# Patient Record
Sex: Female | Born: 1940 | Race: White | Hispanic: No | Smoking: Never smoker
Health system: Southern US, Community
[De-identification: ages and names within clinical notes are randomized; demographics above are authoritative.]

## PROBLEM LIST (undated history)

## (undated) DIAGNOSIS — M5430 Sciatica, unspecified side: Secondary | ICD-10-CM

## (undated) DIAGNOSIS — N952 Postmenopausal atrophic vaginitis: Secondary | ICD-10-CM

## (undated) DIAGNOSIS — R7611 Nonspecific reaction to tuberculin skin test without active tuberculosis: Secondary | ICD-10-CM

## (undated) DIAGNOSIS — M1990 Unspecified osteoarthritis, unspecified site: Secondary | ICD-10-CM

## (undated) DIAGNOSIS — Z87440 Personal history of urinary (tract) infections: Secondary | ICD-10-CM

## (undated) DIAGNOSIS — I10 Essential (primary) hypertension: Secondary | ICD-10-CM

## (undated) HISTORY — DX: Essential (primary) hypertension: I10

## (undated) HISTORY — DX: Sciatica, unspecified side: M54.30

## (undated) HISTORY — DX: Postmenopausal atrophic vaginitis: N95.2

## (undated) HISTORY — DX: Personal history of urinary (tract) infections: Z87.440

## (undated) HISTORY — DX: Nonspecific reaction to tuberculin skin test without active tuberculosis: R76.11

## (undated) HISTORY — DX: Unspecified osteoarthritis, unspecified site: M19.90

## (undated) HISTORY — PX: ENDOMETRIAL ABLATION: SHX621

## (undated) HISTORY — PX: INGUINAL HERNIA REPAIR: SUR1180

## (undated) HISTORY — PX: APPENDECTOMY: SHX54

---

## 1998-01-05 ENCOUNTER — Other Ambulatory Visit: Admission: RE | Admit: 1998-01-05 | Discharge: 1998-01-05 | Payer: Self-pay | Admitting: *Deleted

## 1999-01-22 ENCOUNTER — Other Ambulatory Visit: Admission: RE | Admit: 1999-01-22 | Discharge: 1999-01-22 | Payer: Self-pay | Admitting: *Deleted

## 2000-01-20 ENCOUNTER — Encounter: Admission: RE | Admit: 2000-01-20 | Discharge: 2000-01-20 | Payer: Self-pay | Admitting: *Deleted

## 2000-02-04 ENCOUNTER — Other Ambulatory Visit: Admission: RE | Admit: 2000-02-04 | Discharge: 2000-02-04 | Payer: Self-pay | Admitting: *Deleted

## 2001-01-21 ENCOUNTER — Encounter: Admission: RE | Admit: 2001-01-21 | Discharge: 2001-01-21 | Payer: Self-pay | Admitting: *Deleted

## 2001-02-23 ENCOUNTER — Other Ambulatory Visit: Admission: RE | Admit: 2001-02-23 | Discharge: 2001-02-23 | Payer: Self-pay | Admitting: *Deleted

## 2002-01-24 ENCOUNTER — Encounter: Admission: RE | Admit: 2002-01-24 | Discharge: 2002-01-24 | Payer: Self-pay | Admitting: *Deleted

## 2002-02-24 ENCOUNTER — Other Ambulatory Visit: Admission: RE | Admit: 2002-02-24 | Discharge: 2002-02-24 | Payer: Self-pay | Admitting: *Deleted

## 2003-02-16 ENCOUNTER — Encounter: Admission: RE | Admit: 2003-02-16 | Discharge: 2003-02-16 | Payer: Self-pay | Admitting: *Deleted

## 2003-04-19 ENCOUNTER — Other Ambulatory Visit: Admission: RE | Admit: 2003-04-19 | Discharge: 2003-04-19 | Payer: Self-pay | Admitting: *Deleted

## 2004-03-12 ENCOUNTER — Encounter: Admission: RE | Admit: 2004-03-12 | Discharge: 2004-03-12 | Payer: Self-pay | Admitting: *Deleted

## 2005-01-08 ENCOUNTER — Ambulatory Visit: Payer: Self-pay

## 2007-03-15 ENCOUNTER — Encounter: Admission: RE | Admit: 2007-03-15 | Discharge: 2007-03-15 | Payer: Self-pay | Admitting: Internal Medicine

## 2008-03-14 ENCOUNTER — Ambulatory Visit: Payer: Self-pay | Admitting: Internal Medicine

## 2008-10-05 ENCOUNTER — Ambulatory Visit: Payer: Self-pay | Admitting: Internal Medicine

## 2009-04-09 ENCOUNTER — Ambulatory Visit: Payer: Self-pay | Admitting: Internal Medicine

## 2009-07-09 ENCOUNTER — Ambulatory Visit: Payer: Self-pay | Admitting: Internal Medicine

## 2009-11-30 ENCOUNTER — Ambulatory Visit: Payer: Self-pay | Admitting: Internal Medicine

## 2010-04-16 ENCOUNTER — Ambulatory Visit: Payer: Self-pay | Admitting: Internal Medicine

## 2010-10-31 ENCOUNTER — Encounter: Payer: Self-pay | Admitting: Internal Medicine

## 2010-10-31 ENCOUNTER — Ambulatory Visit (INDEPENDENT_AMBULATORY_CARE_PROVIDER_SITE_OTHER): Payer: Medicare Other | Admitting: Internal Medicine

## 2010-10-31 DIAGNOSIS — F419 Anxiety disorder, unspecified: Secondary | ICD-10-CM

## 2010-10-31 DIAGNOSIS — I10 Essential (primary) hypertension: Secondary | ICD-10-CM

## 2010-10-31 DIAGNOSIS — G4700 Insomnia, unspecified: Secondary | ICD-10-CM | POA: Insufficient documentation

## 2010-10-31 DIAGNOSIS — F411 Generalized anxiety disorder: Secondary | ICD-10-CM

## 2010-10-31 MED ORDER — LISINOPRIL 5 MG PO TABS: 5.0000 mg | ORAL_TABLET | Freq: Every day | ORAL | Status: DC

## 2010-10-31 NOTE — Progress Notes (Signed)
  Subjective:    Patient ID: Amber Garrett, female    DOB: 1940-08-08, 70 y.o.   MRN: [id number]  HPI for 6 month follow up on HTN , anxiety, and insomnia. Wants to cut down on Xanax if possible. Can taper slowly. Is currently taking Xanax tid. Takes Ambien 10 mg hs.    Review of Systems     Objective:   Physical Exam Chest is clear; Cor RRR; Ext without edema.        Assessment & Plan:  1- Anxiety-stable may taper Xanax slowly over 2 months 2- Hypertension- stable on Lisinopril. 3- Insomnia- pretty much needs Ambien hs.

## 2010-10-31 NOTE — Patient Instructions (Signed)
Take meds as directed . See [place] in 6 months.

## 2010-12-05 ENCOUNTER — Other Ambulatory Visit: Payer: Self-pay | Admitting: *Deleted

## 2010-12-05 MED ORDER — ALPRAZOLAM 0.5 MG PO TABS: ORAL_TABLET | ORAL | Status: DC

## 2011-01-01 ENCOUNTER — Other Ambulatory Visit: Payer: Self-pay

## 2011-01-01 MED ORDER — ZOLPIDEM TARTRATE 10 MG PO TABS: 10.0000 mg | ORAL_TABLET | Freq: Every evening | ORAL | Status: DC | PRN

## 2011-04-03 ENCOUNTER — Ambulatory Visit (INDEPENDENT_AMBULATORY_CARE_PROVIDER_SITE_OTHER): Payer: Medicare Other | Admitting: Internal Medicine

## 2011-04-03 ENCOUNTER — Encounter: Payer: Self-pay | Admitting: Internal Medicine

## 2011-04-03 ENCOUNTER — Telehealth: Payer: Self-pay

## 2011-04-03 VITALS — BP 100/82 | HR 84 | Temp 101.6°F

## 2011-04-03 DIAGNOSIS — J111 Influenza due to unidentified influenza virus with other respiratory manifestations: Secondary | ICD-10-CM

## 2011-04-03 DIAGNOSIS — H9190 Unspecified hearing loss, unspecified ear: Secondary | ICD-10-CM | POA: Insufficient documentation

## 2011-04-03 DIAGNOSIS — R05 Cough: Secondary | ICD-10-CM

## 2011-04-03 DIAGNOSIS — J40 Bronchitis, not specified as acute or chronic: Secondary | ICD-10-CM

## 2011-04-03 DIAGNOSIS — R509 Fever, unspecified: Secondary | ICD-10-CM

## 2011-04-03 LAB — CBC WITH DIFFERENTIAL/PLATELET
HCT: 39.4 % (ref 36.0–46.0)
Hemoglobin: 12.7 g/dL (ref 12.0–15.0)
Lymphs Abs: 1.5 10*3/uL (ref 0.7–4.0)
MCH: 30.9 pg (ref 26.0–34.0)
MCHC: 32.3 g/dL (ref 30.0–36.0)
Monocytes Absolute: 0.4 10*3/uL (ref 0.1–1.0)
Monocytes Relative: 5 % (ref 3–12)
Neutro Abs: 5.9 10*3/uL (ref 1.7–7.7)
RBC: 4.11 MIL/uL (ref 3.87–5.11)

## 2011-04-03 NOTE — Progress Notes (Signed)
Attempted to call patient. No answer at  2:22pm

## 2011-04-03 NOTE — Progress Notes (Signed)
  Subjective:    Patient ID: Amber Garrett, female    DOB: [birth year], 70 y.o.   MRN: [id number]  HPI Patient took an influenza immunization on  the morning of November 26. Several hours later she began to have dry cough. The following day she went to see a new grandchild out-of-town. Yesterday began with fever, chills and myalgias. She thinks influenza immunization made her ill but I think it was a coincidence. We are ready seeing influenza in the community. Cough is nonproductive but sounds deep and congested. Initially had a bit of a scratchy throat. Decreased appetite, lethargy and malaise.    Review of Systems     Objective:   Physical Exam HEENT exam: TMs are clear. She wears bilateral hearing aids. Pharynx is only slightly injected without exudate. Neck is supple without adenopathy. Chest clear to auscultation.        Assessment & Plan:  Influenza  Bronchitis  Hypertension  Hearing loss  Plan: CBC with differential obtained. Nasal swab for influenza testing. Prescribe Tamiflu 75 mg twice daily for 5 days. Zithromax Z-PAK take 2 tablets by mouth day one followed by 1 tablet by mouth days 2 through 5 for bronchitis. Tessalon Perles 100 mg proceed #60) 2 by mouth 3 times a day when necessary cough with no refill. Call if not better in 48-72 hours or sooner if worse.  Addendum CBC shows white blood cell count be within normal limits.

## 2011-04-03 NOTE — Patient Instructions (Signed)
Start Tamiflu immediately 75 mg twice daily for 5 days. Take Zithromax Z-PAK 2 tablets by mouth day one followed by 1 tablet by mouth days 2 through 5. Take Tessalon Perles as needed for cough 2 tablets by mouth 3 times daily. Call if not better in 48-72 hours or sooner if worse. It is my opinion that you were exposed to influenza prior to taking the vaccine on Monday morning November 26.

## 2011-04-04 ENCOUNTER — Telehealth: Payer: Self-pay

## 2011-04-04 NOTE — Telephone Encounter (Signed)
Patient informed of cbc results. Feeling better today already.

## 2011-04-08 NOTE — Telephone Encounter (Signed)
Patient informed of her cbc results.

## 2011-05-12 ENCOUNTER — Other Ambulatory Visit: Payer: Medicare Other | Admitting: Internal Medicine

## 2011-05-12 DIAGNOSIS — I10 Essential (primary) hypertension: Secondary | ICD-10-CM | POA: Diagnosis not present

## 2011-05-12 DIAGNOSIS — E559 Vitamin D deficiency, unspecified: Secondary | ICD-10-CM

## 2011-05-12 DIAGNOSIS — F411 Generalized anxiety disorder: Secondary | ICD-10-CM | POA: Diagnosis not present

## 2011-05-12 LAB — CBC WITH DIFFERENTIAL/PLATELET
Eosinophils Absolute: 0.1 10*3/uL (ref 0.0–0.7)
Eosinophils Relative: 2 % (ref 0–5)
Lymphs Abs: 2.5 10*3/uL (ref 0.7–4.0)
MCH: 32.2 pg (ref 26.0–34.0)
MCHC: 33.5 g/dL (ref 30.0–36.0)
MCV: 96.2 fL (ref 78.0–100.0)
Platelets: 273 10*3/uL (ref 150–400)
RBC: 4.47 MIL/uL (ref 3.87–5.11)

## 2011-05-12 LAB — LIPID PANEL
Cholesterol: 199 mg/dL (ref 0–200)
HDL: 80 mg/dL (ref >39)
Total CHOL/HDL Ratio: 2.5 Ratio

## 2011-05-12 LAB — COMPREHENSIVE METABOLIC PANEL
ALT: 12 U/L (ref 0–35)
CO2: 26 mEq/L (ref 19–32)
Creat: 0.9 mg/dL (ref 0.50–1.10)
Glucose, Bld: 93 mg/dL (ref 70–99)
Total Bilirubin: 0.6 mg/dL (ref 0.3–1.2)

## 2011-05-12 LAB — TSH: TSH: 1.42 u[IU]/mL (ref 0.350–4.500)

## 2011-05-13 ENCOUNTER — Ambulatory Visit (INDEPENDENT_AMBULATORY_CARE_PROVIDER_SITE_OTHER): Payer: Medicare Other | Admitting: Internal Medicine

## 2011-05-13 ENCOUNTER — Encounter: Payer: Self-pay | Admitting: Internal Medicine

## 2011-05-13 VITALS — BP 114/82 | HR 76 | Temp 98.6°F | Ht 64.0 in | Wt 137.0 lb

## 2011-05-13 DIAGNOSIS — M8580 Other specified disorders of bone density and structure, unspecified site: Secondary | ICD-10-CM

## 2011-05-13 DIAGNOSIS — M1990 Unspecified osteoarthritis, unspecified site: Secondary | ICD-10-CM

## 2011-05-13 DIAGNOSIS — Z0000 Encounter for general adult medical examination without abnormal findings: Secondary | ICD-10-CM | POA: Diagnosis not present

## 2011-05-13 DIAGNOSIS — F411 Generalized anxiety disorder: Secondary | ICD-10-CM

## 2011-05-13 DIAGNOSIS — F419 Anxiety disorder, unspecified: Secondary | ICD-10-CM

## 2011-05-13 DIAGNOSIS — M949 Disorder of cartilage, unspecified: Secondary | ICD-10-CM | POA: Diagnosis not present

## 2011-05-13 DIAGNOSIS — Z228 Carrier of other infectious diseases: Secondary | ICD-10-CM

## 2011-05-13 DIAGNOSIS — I10 Essential (primary) hypertension: Secondary | ICD-10-CM

## 2011-05-13 DIAGNOSIS — Z9289 Personal history of other medical treatment: Secondary | ICD-10-CM

## 2011-05-13 DIAGNOSIS — Z8669 Personal history of other diseases of the nervous system and sense organs: Secondary | ICD-10-CM

## 2011-05-13 DIAGNOSIS — M899 Disorder of bone, unspecified: Secondary | ICD-10-CM

## 2011-05-13 LAB — POCT URINALYSIS DIPSTICK
Bilirubin, UA: NEGATIVE
Blood, UA: NEGATIVE
Ketones, UA: NEGATIVE
Spec Grav, UA: 1.015
pH, UA: 6.5

## 2011-05-19 DIAGNOSIS — H2510 Age-related nuclear cataract, unspecified eye: Secondary | ICD-10-CM | POA: Diagnosis not present

## 2011-05-26 ENCOUNTER — Encounter: Payer: Self-pay | Admitting: Internal Medicine

## 2011-05-26 DIAGNOSIS — Z1231 Encounter for screening mammogram for malignant neoplasm of breast: Secondary | ICD-10-CM | POA: Diagnosis not present

## 2011-07-03 ENCOUNTER — Other Ambulatory Visit: Payer: Self-pay

## 2011-07-03 MED ORDER — ALPRAZOLAM 0.5 MG PO TABS: ORAL_TABLET | ORAL | Status: DC

## 2011-07-03 MED ORDER — ZOLPIDEM TARTRATE 10 MG PO TABS: 10.0000 mg | ORAL_TABLET | Freq: Every evening | ORAL | Status: DC | PRN

## 2011-07-11 DIAGNOSIS — S239XXA Sprain of unspecified parts of thorax, initial encounter: Secondary | ICD-10-CM | POA: Diagnosis not present

## 2011-07-11 DIAGNOSIS — S335XXA Sprain of ligaments of lumbar spine, initial encounter: Secondary | ICD-10-CM | POA: Diagnosis not present

## 2011-07-11 DIAGNOSIS — M999 Biomechanical lesion, unspecified: Secondary | ICD-10-CM | POA: Diagnosis not present

## 2011-07-11 DIAGNOSIS — M5380 Other specified dorsopathies, site unspecified: Secondary | ICD-10-CM | POA: Diagnosis not present

## 2011-07-15 DIAGNOSIS — S239XXA Sprain of unspecified parts of thorax, initial encounter: Secondary | ICD-10-CM | POA: Diagnosis not present

## 2011-07-15 DIAGNOSIS — S335XXA Sprain of ligaments of lumbar spine, initial encounter: Secondary | ICD-10-CM | POA: Diagnosis not present

## 2011-07-15 DIAGNOSIS — M5380 Other specified dorsopathies, site unspecified: Secondary | ICD-10-CM | POA: Diagnosis not present

## 2011-07-15 DIAGNOSIS — M999 Biomechanical lesion, unspecified: Secondary | ICD-10-CM | POA: Diagnosis not present

## 2011-07-18 DIAGNOSIS — S335XXA Sprain of ligaments of lumbar spine, initial encounter: Secondary | ICD-10-CM | POA: Diagnosis not present

## 2011-07-18 DIAGNOSIS — M5380 Other specified dorsopathies, site unspecified: Secondary | ICD-10-CM | POA: Diagnosis not present

## 2011-07-18 DIAGNOSIS — S239XXA Sprain of unspecified parts of thorax, initial encounter: Secondary | ICD-10-CM | POA: Diagnosis not present

## 2011-07-18 DIAGNOSIS — M999 Biomechanical lesion, unspecified: Secondary | ICD-10-CM | POA: Diagnosis not present

## 2011-07-19 NOTE — Progress Notes (Signed)
Subjective:    Patient ID: Amber Garrett, female    DOB: 11/02/1940, 71 y.o.   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   Blood pressure is well controlled on Zestril. Take Xanax sparingly for anxiety. Her second husband has multiple sclerosis but is doing fairly well.  Past medical history: Possible allergy to Macrodantin because it caused rash on arms only. History of C-section and incisional hernia with appendectomy 1973, C-section in [place] in 1970, endometrial polyp, endometrial ablation and resection of submucous myoma 1998. Pneumovax November 2009, gets annual influenza immunization. Colonoscopy 2007 with repeat study suggested in 10 years. Gets annual mammogram.  Family history: Father died at age 19 of pulmonary fibrosis. Mother with history of hypertension and dementia. One brother in good health. A son and a daughter in good health.  Social history: Patient has a Geographical information systems officer. Does not smoke social alcohol consumption consisting of a couple glasses of wine per week. She does strength training and walking regularly.  In 2006 patient had 2-D echocardiogram to evaluate cardiac murmur. She had moderate tricuspid regurgitation with elevation of right ventricular systolic pressure at 30-40 mm consistent with mild pulmonary hypertension. Mitral valve leaflets were thickened but opened well with no stenosis. There was trace mitral regurgitation.  Dr. Kirstie Peri needed colonoscopy 10/15/2005 showing only diverticulosis.  History of bone density study January 2012 showing osteopenia.    Review of Systems  Constitutional: Negative.   HENT: Negative.   Eyes: Negative.   Respiratory: Negative.   Cardiovascular:       History of systolic murmur see dictation regarding 2-D echocardiogram 2006  Gastrointestinal: Negative.     Genitourinary:       History of vaginal atrophy  Musculoskeletal: Negative.   Neurological: Negative.   Psychiatric/Behavioral:       History of anxiety and insomnia       Objective:   Physical Exam  Nursing note and vitals reviewed. Constitutional: She is oriented to person, place, and time. She appears well-developed and well-nourished. No distress.  HENT:  Head: Normocephalic and atraumatic.  Right Ear: External ear normal.  Left Ear: External ear normal.  Mouth/Throat: Oropharynx is clear and moist.  Eyes: Conjunctivae and EOM are normal. Pupils are equal, round, and reactive to light. Right eye exhibits no discharge. Left eye exhibits no discharge. No scleral icterus.  Neck: Neck supple. No JVD present. No thyromegaly present.  Cardiovascular: Normal rate, regular rhythm and intact distal pulses.   Murmur heard.      1/6 systolic ejection murmur  Pulmonary/Chest: Effort normal and breath sounds normal.       Breasts normal female  Abdominal: Soft. Bowel sounds are normal. She exhibits no mass. There is no tenderness. There is no rebound.  Genitourinary:       Deferred to [facility]  Musculoskeletal: Normal range of motion. She exhibits no edema.  Lymphadenopathy:    She has no cervical adenopathy.  Neurological: She is alert and oriented to person, place, and time. She has normal reflexes. No cranial nerve deficit.  Skin: Skin is warm and dry. No rash noted. She is not diaphoretic.  Psychiatric: She has a normal mood and affect. Her behavior is normal. Judgment and thought content normal.          Assessment & Plan:  Hypertension-well controlled  Anxiety  Insomnia  History of positive PPD  Osteopenia  Osteoarthritis  History of sciatica  History of vaginal atrophy  Plan: Patient is to return in 6 months for office visit blood pressure check

## 2011-07-19 NOTE — Patient Instructions (Signed)
Continue same medications and return in 6 months 

## 2011-07-21 DIAGNOSIS — S239XXA Sprain of unspecified parts of thorax, initial encounter: Secondary | ICD-10-CM | POA: Diagnosis not present

## 2011-07-21 DIAGNOSIS — M5380 Other specified dorsopathies, site unspecified: Secondary | ICD-10-CM | POA: Diagnosis not present

## 2011-07-21 DIAGNOSIS — M999 Biomechanical lesion, unspecified: Secondary | ICD-10-CM | POA: Diagnosis not present

## 2011-07-21 DIAGNOSIS — S335XXA Sprain of ligaments of lumbar spine, initial encounter: Secondary | ICD-10-CM | POA: Diagnosis not present

## 2011-07-24 DIAGNOSIS — M5380 Other specified dorsopathies, site unspecified: Secondary | ICD-10-CM | POA: Diagnosis not present

## 2011-07-24 DIAGNOSIS — S239XXA Sprain of unspecified parts of thorax, initial encounter: Secondary | ICD-10-CM | POA: Diagnosis not present

## 2011-07-24 DIAGNOSIS — S335XXA Sprain of ligaments of lumbar spine, initial encounter: Secondary | ICD-10-CM | POA: Diagnosis not present

## 2011-07-24 DIAGNOSIS — M999 Biomechanical lesion, unspecified: Secondary | ICD-10-CM | POA: Diagnosis not present

## 2011-07-28 DIAGNOSIS — M999 Biomechanical lesion, unspecified: Secondary | ICD-10-CM | POA: Diagnosis not present

## 2011-07-28 DIAGNOSIS — M5380 Other specified dorsopathies, site unspecified: Secondary | ICD-10-CM | POA: Diagnosis not present

## 2011-07-28 DIAGNOSIS — S335XXA Sprain of ligaments of lumbar spine, initial encounter: Secondary | ICD-10-CM | POA: Diagnosis not present

## 2011-07-28 DIAGNOSIS — S239XXA Sprain of unspecified parts of thorax, initial encounter: Secondary | ICD-10-CM | POA: Diagnosis not present

## 2011-07-31 DIAGNOSIS — S335XXA Sprain of ligaments of lumbar spine, initial encounter: Secondary | ICD-10-CM | POA: Diagnosis not present

## 2011-07-31 DIAGNOSIS — M5380 Other specified dorsopathies, site unspecified: Secondary | ICD-10-CM | POA: Diagnosis not present

## 2011-07-31 DIAGNOSIS — M999 Biomechanical lesion, unspecified: Secondary | ICD-10-CM | POA: Diagnosis not present

## 2011-07-31 DIAGNOSIS — S239XXA Sprain of unspecified parts of thorax, initial encounter: Secondary | ICD-10-CM | POA: Diagnosis not present

## 2011-08-12 DIAGNOSIS — M5380 Other specified dorsopathies, site unspecified: Secondary | ICD-10-CM | POA: Diagnosis not present

## 2011-08-12 DIAGNOSIS — M999 Biomechanical lesion, unspecified: Secondary | ICD-10-CM | POA: Diagnosis not present

## 2011-08-12 DIAGNOSIS — S335XXA Sprain of ligaments of lumbar spine, initial encounter: Secondary | ICD-10-CM | POA: Diagnosis not present

## 2011-08-12 DIAGNOSIS — S239XXA Sprain of unspecified parts of thorax, initial encounter: Secondary | ICD-10-CM | POA: Diagnosis not present

## 2011-08-14 DIAGNOSIS — S335XXA Sprain of ligaments of lumbar spine, initial encounter: Secondary | ICD-10-CM | POA: Diagnosis not present

## 2011-08-14 DIAGNOSIS — S239XXA Sprain of unspecified parts of thorax, initial encounter: Secondary | ICD-10-CM | POA: Diagnosis not present

## 2011-08-14 DIAGNOSIS — M5380 Other specified dorsopathies, site unspecified: Secondary | ICD-10-CM | POA: Diagnosis not present

## 2011-08-14 DIAGNOSIS — M999 Biomechanical lesion, unspecified: Secondary | ICD-10-CM | POA: Diagnosis not present

## 2011-08-17 DIAGNOSIS — J019 Acute sinusitis, unspecified: Secondary | ICD-10-CM | POA: Diagnosis not present

## 2011-08-17 DIAGNOSIS — J209 Acute bronchitis, unspecified: Secondary | ICD-10-CM | POA: Diagnosis not present

## 2011-08-28 DIAGNOSIS — S335XXA Sprain of ligaments of lumbar spine, initial encounter: Secondary | ICD-10-CM | POA: Diagnosis not present

## 2011-08-28 DIAGNOSIS — M999 Biomechanical lesion, unspecified: Secondary | ICD-10-CM | POA: Diagnosis not present

## 2011-08-28 DIAGNOSIS — S239XXA Sprain of unspecified parts of thorax, initial encounter: Secondary | ICD-10-CM | POA: Diagnosis not present

## 2011-09-09 DIAGNOSIS — M999 Biomechanical lesion, unspecified: Secondary | ICD-10-CM | POA: Diagnosis not present

## 2011-09-09 DIAGNOSIS — S239XXA Sprain of unspecified parts of thorax, initial encounter: Secondary | ICD-10-CM | POA: Diagnosis not present

## 2011-09-09 DIAGNOSIS — S335XXA Sprain of ligaments of lumbar spine, initial encounter: Secondary | ICD-10-CM | POA: Diagnosis not present

## 2011-09-16 DIAGNOSIS — S239XXA Sprain of unspecified parts of thorax, initial encounter: Secondary | ICD-10-CM | POA: Diagnosis not present

## 2011-09-16 DIAGNOSIS — M999 Biomechanical lesion, unspecified: Secondary | ICD-10-CM | POA: Diagnosis not present

## 2011-09-16 DIAGNOSIS — S335XXA Sprain of ligaments of lumbar spine, initial encounter: Secondary | ICD-10-CM | POA: Diagnosis not present

## 2011-09-26 DIAGNOSIS — S335XXA Sprain of ligaments of lumbar spine, initial encounter: Secondary | ICD-10-CM | POA: Diagnosis not present

## 2011-09-26 DIAGNOSIS — S239XXA Sprain of unspecified parts of thorax, initial encounter: Secondary | ICD-10-CM | POA: Diagnosis not present

## 2011-09-26 DIAGNOSIS — M999 Biomechanical lesion, unspecified: Secondary | ICD-10-CM | POA: Diagnosis not present

## 2011-10-08 DIAGNOSIS — M999 Biomechanical lesion, unspecified: Secondary | ICD-10-CM | POA: Diagnosis not present

## 2011-10-08 DIAGNOSIS — S335XXA Sprain of ligaments of lumbar spine, initial encounter: Secondary | ICD-10-CM | POA: Diagnosis not present

## 2011-10-08 DIAGNOSIS — S239XXA Sprain of unspecified parts of thorax, initial encounter: Secondary | ICD-10-CM | POA: Diagnosis not present

## 2011-10-22 DIAGNOSIS — M999 Biomechanical lesion, unspecified: Secondary | ICD-10-CM | POA: Diagnosis not present

## 2011-10-22 DIAGNOSIS — S335XXA Sprain of ligaments of lumbar spine, initial encounter: Secondary | ICD-10-CM | POA: Diagnosis not present

## 2011-10-22 DIAGNOSIS — S239XXA Sprain of unspecified parts of thorax, initial encounter: Secondary | ICD-10-CM | POA: Diagnosis not present

## 2011-11-05 DIAGNOSIS — S335XXA Sprain of ligaments of lumbar spine, initial encounter: Secondary | ICD-10-CM | POA: Diagnosis not present

## 2011-11-05 DIAGNOSIS — S239XXA Sprain of unspecified parts of thorax, initial encounter: Secondary | ICD-10-CM | POA: Diagnosis not present

## 2011-11-05 DIAGNOSIS — M999 Biomechanical lesion, unspecified: Secondary | ICD-10-CM | POA: Diagnosis not present

## 2011-11-07 ENCOUNTER — Other Ambulatory Visit: Payer: Self-pay

## 2011-11-07 DIAGNOSIS — I10 Essential (primary) hypertension: Secondary | ICD-10-CM

## 2011-11-07 MED ORDER — LISINOPRIL 5 MG PO TABS: 5.0000 mg | ORAL_TABLET | Freq: Every day | ORAL | Status: DC

## 2011-11-13 ENCOUNTER — Encounter: Payer: Self-pay | Admitting: Internal Medicine

## 2011-11-13 ENCOUNTER — Ambulatory Visit (INDEPENDENT_AMBULATORY_CARE_PROVIDER_SITE_OTHER): Payer: Medicare Other | Admitting: Internal Medicine

## 2011-11-13 VITALS — BP 108/78 | HR 68 | Temp 97.9°F | Ht 64.0 in | Wt 141.0 lb

## 2011-11-13 DIAGNOSIS — J309 Allergic rhinitis, unspecified: Secondary | ICD-10-CM | POA: Insufficient documentation

## 2011-11-13 DIAGNOSIS — M549 Dorsalgia, unspecified: Secondary | ICD-10-CM | POA: Insufficient documentation

## 2011-11-13 DIAGNOSIS — G4700 Insomnia, unspecified: Secondary | ICD-10-CM | POA: Diagnosis not present

## 2011-11-13 DIAGNOSIS — I10 Essential (primary) hypertension: Secondary | ICD-10-CM

## 2011-11-13 DIAGNOSIS — F419 Anxiety disorder, unspecified: Secondary | ICD-10-CM

## 2011-11-13 DIAGNOSIS — F411 Generalized anxiety disorder: Secondary | ICD-10-CM

## 2011-11-13 LAB — BASIC METABOLIC PANEL
Chloride: 102 mEq/L (ref 96–112)
Potassium: 4.3 mEq/L (ref 3.5–5.3)
Sodium: 137 mEq/L (ref 135–145)

## 2011-11-13 NOTE — Progress Notes (Signed)
  Subjective:    Patient ID: Amber Garrett, female    DOB: [birth year], 71 y.o.   MRN: [id number]  HPI 71 year old white female with history of hypertension in today for six-month recheck. Over the past several weeks had a sinus infection for which a Z-Pak and generic Flonase nasal spray were prescribed. She is found Flonase nasal spray to be helpful. She's had some issues recently with right radiculopathy and low back pain. Sometimes it has kept her awake at night. She has seen a chiropractor for treatment. 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 Had x-ray done by chiropractor showing some degenerative disc disease. Was also told she has scoliosis. Has not had an MRI. Husband has had MS for about 11 years she thinks. Recently saw Dr. Sandria Manly who placed him on new medication. This situation is stressful for patient. Her mother who is in her 90s is also causing some stress as well. Patient takes Xanax as needed for anxiety. Takes Ambien to sleep.    Review of Systems     Objective:   Physical Exam HEENT exam: TMs and pharynx are clear; neck is supple without thyromegaly; chest: clear to auscultation; cardiac exam: regular rate and rhythm normal S1 and S2; extremities without edema. Straight leg raising on the right is negative at 90. Muscle strength is 5 over 5 in the right lower extremity and deep tendon reflexes 2+ and symmetrical       Assessment & Plan:  Allergic rhinitis-refill generic Flonase nasal spray for one year 2 sprays in each nostril daily  Anxiety-has Xanax on hand  Insomnia-treat with Ambien  Right radiculopathy and low back pain: Symptoms are consistent with sciatica. Patient could be referred to physical therapy.. Hold off on that for nail. Prescribe Flexeril 10 mg (#30) one half to one tablet by mouth each bedtime as needed #30 with refills.  Hypertension-under good control on current regimen  Plan: Return in 6 months for physical examination. Back pain persists, consider  referral to physical therapy before getting MRI. She's been dealing with these symptoms for years but only recently has had recurrence.

## 2011-11-13 NOTE — Patient Instructions (Addendum)
Continue same treatment for hypertension. Take Flexeril 1/2-1 tablet at bedtime as needed for severe back pain. Use Flonase nasal spray daily as needed for allergic rhinitis symptoms. Continue Ambien for insomnia and Xanax for anxiety. See again in 6 months for physical exam.

## 2011-11-18 DIAGNOSIS — S335XXA Sprain of ligaments of lumbar spine, initial encounter: Secondary | ICD-10-CM | POA: Diagnosis not present

## 2011-11-18 DIAGNOSIS — M999 Biomechanical lesion, unspecified: Secondary | ICD-10-CM | POA: Diagnosis not present

## 2011-11-18 DIAGNOSIS — S239XXA Sprain of unspecified parts of thorax, initial encounter: Secondary | ICD-10-CM | POA: Diagnosis not present

## 2011-11-24 ENCOUNTER — Other Ambulatory Visit: Payer: Self-pay

## 2011-11-24 MED ORDER — ZOLPIDEM TARTRATE 10 MG PO TABS: 10.0000 mg | ORAL_TABLET | Freq: Every evening | ORAL | Status: DC | PRN

## 2012-01-22 ENCOUNTER — Other Ambulatory Visit: Payer: Self-pay

## 2012-01-22 MED ORDER — ALPRAZOLAM 0.5 MG PO TABS: ORAL_TABLET | ORAL | Status: DC

## 2012-02-11 DIAGNOSIS — Z23 Encounter for immunization: Secondary | ICD-10-CM | POA: Diagnosis not present

## 2012-03-11 DIAGNOSIS — L821 Other seborrheic keratosis: Secondary | ICD-10-CM | POA: Diagnosis not present

## 2012-03-11 DIAGNOSIS — B353 Tinea pedis: Secondary | ICD-10-CM | POA: Diagnosis not present

## 2012-03-11 DIAGNOSIS — D235 Other benign neoplasm of skin of trunk: Secondary | ICD-10-CM | POA: Diagnosis not present

## 2012-03-11 DIAGNOSIS — L570 Actinic keratosis: Secondary | ICD-10-CM | POA: Diagnosis not present

## 2012-03-24 DIAGNOSIS — J019 Acute sinusitis, unspecified: Secondary | ICD-10-CM | POA: Diagnosis not present

## 2012-03-24 DIAGNOSIS — J209 Acute bronchitis, unspecified: Secondary | ICD-10-CM | POA: Diagnosis not present

## 2012-04-20 ENCOUNTER — Other Ambulatory Visit: Payer: Self-pay

## 2012-04-20 ENCOUNTER — Telehealth: Payer: Self-pay | Admitting: Internal Medicine

## 2012-04-20 DIAGNOSIS — F419 Anxiety disorder, unspecified: Secondary | ICD-10-CM

## 2012-04-20 MED ORDER — CLONAZEPAM 0.5 MG PO TABS: 0.5000 mg | ORAL_TABLET | ORAL | Status: DC

## 2012-04-20 NOTE — Telephone Encounter (Signed)
Handwritten note from patient says that she wants to change from Xanax 0.5 mg 3 times daily for anxiety to Klonopin 0.5 mg daily. Says she is concerned about her bodies dependent so Xanax. Says she has chest and heart discomfort after 5 or 6 hours. I suspect that's anxiety and not related to Xanax. Sounds like Xanax is not controlling the anxiety. Her husband had some Klonopin and she has been taking it with good results. We will change to Klonopin 0.5 mg #30 one by mouth daily with 2 refills.

## 2012-04-26 ENCOUNTER — Other Ambulatory Visit: Payer: Self-pay | Admitting: Internal Medicine

## 2012-05-10 ENCOUNTER — Telehealth: Payer: Self-pay

## 2012-05-10 MED ORDER — PROMETHAZINE HCL 25 MG PO TABS: 25.0000 mg | ORAL_TABLET | Freq: Four times a day (QID) | ORAL | Status: DC | PRN

## 2012-05-10 NOTE — Telephone Encounter (Signed)
Call in Phenergan 25 mg tablets #30 one po q 4 hours prn nausea 

## 2012-05-10 NOTE — Telephone Encounter (Signed)
Thinks she has GI bug, cramps and diarrhea, nausea without vomiting. Possible low grade temp. Requesting something for the nausea only

## 2012-05-13 ENCOUNTER — Other Ambulatory Visit: Payer: Medicare Other | Admitting: Internal Medicine

## 2012-05-13 DIAGNOSIS — M8580 Other specified disorders of bone density and structure, unspecified site: Secondary | ICD-10-CM

## 2012-05-13 DIAGNOSIS — I10 Essential (primary) hypertension: Secondary | ICD-10-CM

## 2012-05-13 DIAGNOSIS — M899 Disorder of bone, unspecified: Secondary | ICD-10-CM | POA: Diagnosis not present

## 2012-05-13 LAB — CBC WITH DIFFERENTIAL/PLATELET
Eosinophils Absolute: 0.1 10*3/uL (ref 0.0–0.7)
Eosinophils Relative: 3 % (ref 0–5)
HCT: 39.1 % (ref 36.0–46.0)
Hemoglobin: 13.8 g/dL (ref 12.0–15.0)
Lymphs Abs: 2.2 10*3/uL (ref 0.7–4.0)
MCH: 32.2 pg (ref 26.0–34.0)
MCV: 91.1 fL (ref 78.0–100.0)
Monocytes Relative: 11 % (ref 3–12)
Neutrophils Relative %: 47 % (ref 43–77)
RBC: 4.29 MIL/uL (ref 3.87–5.11)

## 2012-05-13 LAB — COMPREHENSIVE METABOLIC PANEL
Albumin: 4.1 g/dL (ref 3.5–5.2)
CO2: 25 mEq/L (ref 19–32)
Glucose, Bld: 92 mg/dL (ref 70–99)
Sodium: 134 mEq/L — ABNORMAL LOW (ref 135–145)
Total Bilirubin: 0.4 mg/dL (ref 0.3–1.2)
Total Protein: 6.5 g/dL (ref 6.0–8.3)

## 2012-05-13 LAB — LIPID PANEL
Cholesterol: 170 mg/dL (ref 0–200)
Triglycerides: 99 mg/dL (ref <150)
VLDL: 20 mg/dL (ref 0–40)

## 2012-05-14 ENCOUNTER — Other Ambulatory Visit: Payer: Medicare Other | Admitting: Internal Medicine

## 2012-05-17 ENCOUNTER — Encounter: Payer: Self-pay | Admitting: Internal Medicine

## 2012-05-17 ENCOUNTER — Ambulatory Visit (INDEPENDENT_AMBULATORY_CARE_PROVIDER_SITE_OTHER): Payer: Medicare Other | Admitting: Internal Medicine

## 2012-05-17 VITALS — BP 110/74 | HR 80 | Temp 98.2°F | Ht 64.5 in | Wt 137.0 lb

## 2012-05-17 DIAGNOSIS — R7611 Nonspecific reaction to tuberculin skin test without active tuberculosis: Secondary | ICD-10-CM | POA: Diagnosis not present

## 2012-05-17 DIAGNOSIS — F411 Generalized anxiety disorder: Secondary | ICD-10-CM | POA: Diagnosis not present

## 2012-05-17 DIAGNOSIS — F419 Anxiety disorder, unspecified: Secondary | ICD-10-CM

## 2012-05-17 DIAGNOSIS — Z0000 Encounter for general adult medical examination without abnormal findings: Secondary | ICD-10-CM | POA: Diagnosis not present

## 2012-05-17 DIAGNOSIS — M8580 Other specified disorders of bone density and structure, unspecified site: Secondary | ICD-10-CM

## 2012-05-17 DIAGNOSIS — M949 Disorder of cartilage, unspecified: Secondary | ICD-10-CM | POA: Diagnosis not present

## 2012-05-17 DIAGNOSIS — G4700 Insomnia, unspecified: Secondary | ICD-10-CM

## 2012-05-17 DIAGNOSIS — Z8669 Personal history of other diseases of the nervous system and sense organs: Secondary | ICD-10-CM

## 2012-05-17 DIAGNOSIS — J309 Allergic rhinitis, unspecified: Secondary | ICD-10-CM | POA: Diagnosis not present

## 2012-05-17 DIAGNOSIS — Z9289 Personal history of other medical treatment: Secondary | ICD-10-CM

## 2012-05-17 LAB — POCT URINALYSIS DIPSTICK
Bilirubin, UA: NEGATIVE
Blood, UA: NEGATIVE
Ketones, UA: NEGATIVE
Leukocytes, UA: NEGATIVE
Protein, UA: NEGATIVE
Spec Grav, UA: <=1.005
pH, UA: 8

## 2012-05-17 MED ORDER — CLONAZEPAM 0.5 MG PO TABS: 0.5000 mg | ORAL_TABLET | Freq: Every evening | ORAL | Status: DC | PRN

## 2012-05-17 NOTE — Patient Instructions (Addendum)
Order to get mammogram and bone density. Return in 6 months

## 2012-05-17 NOTE — Progress Notes (Signed)
Subjective:    Patient ID: Amber Garrett, female    DOB: [birth year], 72 y.o.   MRN: [id number]  HPI pleasant 72 year old white female for health maintenance and evaluation of medical problems. 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 History of insomnia. History of osteopenia. History of sciatica.  Patient takes Zestril for hypertension.  Past medical history: Possible allergy to Macrodantin because it caused rash on arms only. History of C-section and incisional hernia with appendectomy in 1973. C-section in [place] in 1970. Endometrial polyp, endometrial ablation and resection of submucous myoma 1998.  Pneumovax given November 2009. Gets annual influenza immunization.  Colonoscopy done in 2007 showing only diverticulosis done by Dr. Matthias Hughs with repeat study suggested in 10 years. Gets annual mammogram.  Patient had 2-D echocardiogram in 2006 to evaluate cardiac murmur. She had moderate tricuspid regurgitation with elevation of right ventricular systolic pressure at 30-40 mm consistent with mild pulmonary hypertension. Mitral valve leaflets were thickened but opened well with no stenosis. There was trace mitral regurgitation.  Social history: She has a Geographical information systems officer. Does not smoke. Social alcohol consumption consisting of a couple of glasses of wine per week. She does regular strength training and walking. Second husband is a retired Pensions consultant and has multiple sclerosis.  Family history: Father died at age 93 of pulmonary fibrosis. Mother with history of hypertension and dementia. One brother in good health. A son and daughter in good health.  Bone density study January 2012 showed osteopenia.   Review of Systems  Constitutional: Negative.   Psychiatric/Behavioral:       Anxiety and insomnia  All other systems reviewed and are negative.       Objective:   Physical Exam  Vitals reviewed. Constitutional: She is  oriented to person, place, and time. She appears well-developed and well-nourished. No distress.  HENT:  Head: Normocephalic and atraumatic.  Right Ear: External ear normal.  Left Ear: External ear normal.  Mouth/Throat: Oropharynx is clear and moist. No oropharyngeal exudate.  Eyes: Conjunctivae and EOM are normal. Pupils are equal, round, and reactive to light. Right eye exhibits no discharge. Left eye exhibits no discharge. No scleral icterus.  Neck: Neck supple. No JVD present. No thyromegaly present.  Cardiovascular: Normal rate, regular rhythm and intact distal pulses.   Murmur heard. 1/6 systolic ejection murmur  Pulmonary/Chest: Effort normal and breath sounds normal. No respiratory distress. She has no wheezes. She has no rales.  Breasts normal female  Abdominal: Soft. Bowel sounds are normal. She exhibits no distension and no mass. There is no tenderness. There is no rebound and no guarding.  Genitourinary:  Deferred to GYN  Musculoskeletal: She exhibits no edema.  Lymphadenopathy:    She has no cervical adenopathy.  Neurological: She is alert and oriented to person, place, and time. She has normal reflexes. Coordination normal.  Skin: Skin is warm and dry. No rash noted. She is not diaphoretic.  Psychiatric: She has a normal mood and affect. Her behavior is normal. Judgment and thought content normal.          Assessment & Plan:  Hypertension well controlled  Anxiety related to husband's illness and mother with dementia  History of insomnia  Allergic rhinitis  History of back pain  History of hearing loss  Plan: Return in 6 months or as needed.     Subjective:   Patient presents for Medicare Annual/Subsequent preventive examination.   Review Past Medical/Family/Social: See EPIC   Risk  Factors  Current exercise habits:  Go to Gym works with trainer twice, pilates, Had to cut out Zumba and aerobics due to sciatica. Dietary issues discussed:   Cardiac  risk factors:  Depression Screen  (Note: if answer to either of the following is "Yes", a more complete depression screening is indicated)   Over the past two weeks, have you felt down, depressed or hopeless? No  Over the past two weeks, have you felt little interest or pleasure in doing things? No Have you lost interest or pleasure in daily life? No Do you often feel hopeless? No Do you cry easily over simple problems? No   Activities of Daily Living  In your present state of health, do you have any difficulty performing the following activities?:   Driving? No  Managing money? No  Feeding yourself? No  Getting from bed to chair? No  Climbing a flight of stairs? No  Preparing food and eating?: No  Bathing or showering? No  Getting dressed: No  Getting to the toilet? No  Using the toilet:No  Moving around from place to place: No  In the past year have you fallen or had a near fall?:No  Are you sexually active? No  Do you have more than one partner? No   Hearing Difficulties: No  Do you often ask people to speak up or repeat themselves? Yes- wears hearing aids Do you experience ringing or noises in your ears? No  Do you have difficulty understanding soft or whispered voices? yes  Do you feel that you have a problem with memory? No Do you often misplace items? No    Home Safety:  Do you have a smoke alarm at your residence? Yes Do you have grab bars in the bathroom? yes Do you have throw rugs in your house? yes   Cognitive Testing  Alert? Yes Normal Appearance?Yes  Oriented to person? Yes Place? Yes  Time? Yes  Recall of three objects? Yes  Can perform simple calculations? Yes  Displays appropriate judgment?Yes  Can read the correct time from a watch face?Yes   List the Names of Other Physician/Practitioners you currently use:  See referral list for the physicians patient is currently seeing.  [facility]; [facility]; Dr. Hazle Quant    Review of  Systems:see EPIC   Objective:     General appearance: Appears stated age and mildly obese  Head: Normocephalic, without obvious abnormality, atraumatic  Eyes: conj clear, EOMi PEERLA  Ears: normal TM's and external ear canals both ears  Nose: Nares normal. Septum midline. Mucosa normal. No drainage or sinus tenderness.  Throat: lips, mucosa, and tongue normal; teeth and gums normal  Neck: no adenopathy, no carotid bruit, no JVD, supple, symmetrical, trachea midline and thyroid not enlarged, symmetric, no tenderness/mass/nodules  No CVA tenderness.  Lungs: clear to auscultation bilaterally  Breasts: normal appearance, no masses or tenderness Heart: regular rate and rhythm, S1, S2 normal, no murmur, click, rub or gallop  Abdomen: soft, non-tender; bowel sounds normal; no masses, no organomegaly  Musculoskeletal: ROM normal in all joints, no crepitus, no deformity, Normal muscle strengthen. Back  is symmetric, no curvature. Skin: Skin color, texture, turgor normal. No rashes or lesions  Lymph nodes: Cervical, supraclavicular, and axillary nodes normal.  Neurologic: CN 2 -12 Normal, Normal symmetric reflexes. Normal coordination and gait  Psych: Alert & Oriented x 3, Mood appear stable.    Assessment:    Annual wellness medicare exam   Plan:    During  the course of the visit the patient was educated and counseled about appropriate screening and preventive services including:   See EPIC     Patient Instructions (the written plan) was given to the patient.  Medicare Attestation  I have personally reviewed:  The patient's medical and social history  Their use of alcohol, tobacco or illicit drugs  Their current medications and supplements  The patient's functional ability including ADLs,fall risks, home safety risks, cognitive, and hearing and visual impairment  Diet and physical activities  Evidence for depression or mood disorders  The patient's weight, height, BMI, and  visual acuity have been recorded in the chart. I have made referrals, counseling, and provided education to the patient based on review of the above and I have provided the patient with a written personalized care plan for preventive services.

## 2012-05-20 DIAGNOSIS — H2510 Age-related nuclear cataract, unspecified eye: Secondary | ICD-10-CM | POA: Diagnosis not present

## 2012-05-31 DIAGNOSIS — Z1231 Encounter for screening mammogram for malignant neoplasm of breast: Secondary | ICD-10-CM | POA: Diagnosis not present

## 2012-06-22 ENCOUNTER — Other Ambulatory Visit: Payer: Self-pay

## 2012-06-22 MED ORDER — ZOLPIDEM TARTRATE 10 MG PO TABS: 10.0000 mg | ORAL_TABLET | Freq: Every evening | ORAL | Status: DC | PRN

## 2012-08-04 ENCOUNTER — Other Ambulatory Visit: Payer: Self-pay

## 2012-08-04 DIAGNOSIS — F419 Anxiety disorder, unspecified: Secondary | ICD-10-CM

## 2012-08-04 MED ORDER — CLONAZEPAM 0.5 MG PO TABS: 0.5000 mg | ORAL_TABLET | Freq: Every evening | ORAL | Status: DC | PRN

## 2012-08-30 DIAGNOSIS — IMO0002 Reserved for concepts with insufficient information to code with codable children: Secondary | ICD-10-CM | POA: Diagnosis not present

## 2012-10-18 DIAGNOSIS — H2510 Age-related nuclear cataract, unspecified eye: Secondary | ICD-10-CM | POA: Diagnosis not present

## 2012-11-18 ENCOUNTER — Ambulatory Visit (INDEPENDENT_AMBULATORY_CARE_PROVIDER_SITE_OTHER): Payer: Medicare Other | Admitting: Internal Medicine

## 2012-11-18 ENCOUNTER — Encounter: Payer: Self-pay | Admitting: Internal Medicine

## 2012-11-18 VITALS — BP 114/84 | HR 76 | Temp 98.1°F | Wt 142.0 lb

## 2012-11-18 DIAGNOSIS — I10 Essential (primary) hypertension: Secondary | ICD-10-CM

## 2012-11-18 NOTE — Patient Instructions (Addendum)
Continue same meds and return in 6 months 

## 2012-11-18 NOTE — Progress Notes (Signed)
  Subjective:    Patient ID: Amber Garrett, female    DOB: 1940-11-02, 72 y.o.   MRN: [id number]  HPI Follow up on Hypertension. Husband has Altzheimer's and Multiple sclerosis. Short term memeory worse. They have arranged to move to [place] if necessary. Her mother has been in the hospital out of town.    Review of Systems     Objective:   Physical Exam  Vitals reviewed. Constitutional: She appears well-developed and well-nourished. No distress.  Neck: No JVD present.  Cardiovascular: Normal rate, regular rhythm and normal heart sounds.   No murmur heard. Lymphadenopathy:    She has no cervical adenopathy.  Skin: Skin is warm and dry.          Assessment & Plan:  HTN- stable on current regimen Situational Stress Plan: No change in antihypertensive meds Return in 6 months for PE

## 2012-11-26 DIAGNOSIS — H2510 Age-related nuclear cataract, unspecified eye: Secondary | ICD-10-CM | POA: Diagnosis not present

## 2012-12-20 DIAGNOSIS — H269 Unspecified cataract: Secondary | ICD-10-CM | POA: Diagnosis not present

## 2012-12-20 DIAGNOSIS — H2589 Other age-related cataract: Secondary | ICD-10-CM | POA: Diagnosis not present

## 2012-12-20 DIAGNOSIS — H2510 Age-related nuclear cataract, unspecified eye: Secondary | ICD-10-CM | POA: Diagnosis not present

## 2012-12-24 DIAGNOSIS — D2310 Other benign neoplasm of skin of unspecified eyelid, including canthus: Secondary | ICD-10-CM | POA: Diagnosis not present

## 2012-12-24 DIAGNOSIS — D1801 Hemangioma of skin and subcutaneous tissue: Secondary | ICD-10-CM | POA: Diagnosis not present

## 2012-12-27 ENCOUNTER — Other Ambulatory Visit: Payer: Self-pay | Admitting: Internal Medicine

## 2012-12-27 NOTE — Telephone Encounter (Signed)
Refill x 6 months 

## 2013-02-08 DIAGNOSIS — Z23 Encounter for immunization: Secondary | ICD-10-CM | POA: Diagnosis not present

## 2013-02-11 DIAGNOSIS — H2510 Age-related nuclear cataract, unspecified eye: Secondary | ICD-10-CM | POA: Diagnosis not present

## 2013-02-28 DIAGNOSIS — H269 Unspecified cataract: Secondary | ICD-10-CM | POA: Diagnosis not present

## 2013-02-28 DIAGNOSIS — H2589 Other age-related cataract: Secondary | ICD-10-CM | POA: Diagnosis not present

## 2013-02-28 DIAGNOSIS — H2510 Age-related nuclear cataract, unspecified eye: Secondary | ICD-10-CM | POA: Diagnosis not present

## 2013-03-07 ENCOUNTER — Other Ambulatory Visit: Payer: Self-pay | Admitting: Internal Medicine

## 2013-03-07 NOTE — Telephone Encounter (Signed)
Refill x 6 months 

## 2013-03-15 DIAGNOSIS — H905 Unspecified sensorineural hearing loss: Secondary | ICD-10-CM | POA: Diagnosis not present

## 2013-04-26 ENCOUNTER — Other Ambulatory Visit: Payer: Self-pay | Admitting: Internal Medicine

## 2013-05-23 ENCOUNTER — Other Ambulatory Visit: Payer: Medicare Other | Admitting: Internal Medicine

## 2013-05-24 ENCOUNTER — Encounter: Payer: Medicare Other | Admitting: Internal Medicine

## 2013-06-13 DIAGNOSIS — Z8262 Family history of osteoporosis: Secondary | ICD-10-CM | POA: Diagnosis not present

## 2013-06-13 DIAGNOSIS — M949 Disorder of cartilage, unspecified: Secondary | ICD-10-CM | POA: Diagnosis not present

## 2013-06-13 DIAGNOSIS — Z1231 Encounter for screening mammogram for malignant neoplasm of breast: Secondary | ICD-10-CM | POA: Diagnosis not present

## 2013-06-13 DIAGNOSIS — M899 Disorder of bone, unspecified: Secondary | ICD-10-CM | POA: Diagnosis not present

## 2013-06-27 ENCOUNTER — Other Ambulatory Visit: Payer: Self-pay | Admitting: Internal Medicine

## 2013-06-27 NOTE — Telephone Encounter (Signed)
Refill x 6 months. Please call her and see how husband is since we signed orders for Respite care

## 2013-07-18 ENCOUNTER — Other Ambulatory Visit: Payer: Self-pay | Admitting: Internal Medicine

## 2013-07-18 ENCOUNTER — Other Ambulatory Visit: Payer: Medicare Other | Admitting: Internal Medicine

## 2013-07-18 DIAGNOSIS — I10 Essential (primary) hypertension: Secondary | ICD-10-CM

## 2013-07-18 DIAGNOSIS — Z130 Encounter for screening for diseases of the blood and blood-forming organs and certain disorders involving the immune mechanism: Secondary | ICD-10-CM | POA: Diagnosis not present

## 2013-07-18 DIAGNOSIS — Z1329 Encounter for screening for other suspected endocrine disorder: Secondary | ICD-10-CM | POA: Diagnosis not present

## 2013-07-18 DIAGNOSIS — Z13228 Encounter for screening for other metabolic disorders: Secondary | ICD-10-CM

## 2013-07-18 DIAGNOSIS — Z13220 Encounter for screening for lipoid disorders: Secondary | ICD-10-CM | POA: Diagnosis not present

## 2013-07-18 LAB — COMPREHENSIVE METABOLIC PANEL
ALT: 13 U/L (ref 0–35)
AST: 18 U/L (ref 0–37)
Albumin: 4 g/dL (ref 3.5–5.2)
Alkaline Phosphatase: 53 U/L (ref 39–117)
BUN: 21 mg/dL (ref 6–23)
CO2: 28 meq/L (ref 19–32)
Calcium: 9.4 mg/dL (ref 8.4–10.5)
Chloride: 103 mEq/L (ref 96–112)
Creat: 0.98 mg/dL (ref 0.50–1.10)
Glucose, Bld: 95 mg/dL (ref 70–99)
Potassium: 4.6 mEq/L (ref 3.5–5.3)
Sodium: 139 mEq/L (ref 135–145)
Total Bilirubin: 0.6 mg/dL (ref 0.2–1.2)
Total Protein: 6.3 g/dL (ref 6.0–8.3)

## 2013-07-18 LAB — CBC WITH DIFFERENTIAL/PLATELET
Basophils Absolute: 0.1 10*3/uL (ref 0.0–0.1)
Basophils Relative: 1 % (ref 0–1)
EOS ABS: 0.2 10*3/uL (ref 0.0–0.7)
Eosinophils Relative: 3 % (ref 0–5)
HCT: 39.5 % (ref 36.0–46.0)
Hemoglobin: 13.6 g/dL (ref 12.0–15.0)
LYMPHS ABS: 2.4 10*3/uL (ref 0.7–4.0)
LYMPHS PCT: 39 % (ref 12–46)
MCH: 31.9 pg (ref 26.0–34.0)
MCHC: 34.4 g/dL (ref 30.0–36.0)
MCV: 92.7 fL (ref 78.0–100.0)
Monocytes Absolute: 0.6 10*3/uL (ref 0.1–1.0)
Monocytes Relative: 9 % (ref 3–12)
NEUTROS PCT: 48 % (ref 43–77)
Neutro Abs: 3 10*3/uL (ref 1.7–7.7)
PLATELETS: 252 10*3/uL (ref 150–400)
RBC: 4.26 MIL/uL (ref 3.87–5.11)
RDW: 13.6 % (ref 11.5–15.5)
WBC: 6.2 10*3/uL (ref 4.0–10.5)

## 2013-07-18 LAB — LIPID PANEL
CHOLESTEROL: 196 mg/dL (ref 0–200)
HDL: 88 mg/dL (ref >39)
LDL CALC: 86 mg/dL (ref 0–99)
Total CHOL/HDL Ratio: 2.2 Ratio
Triglycerides: 111 mg/dL (ref <150)
VLDL: 22 mg/dL (ref 0–40)

## 2013-07-18 NOTE — Telephone Encounter (Signed)
Refill x 6 months 

## 2013-07-19 LAB — VITAMIN D 25 HYDROXY (VIT D DEFICIENCY, FRACTURES): Vit D, 25-Hydroxy: 63 ng/mL (ref 30–89)

## 2013-07-19 LAB — TSH: TSH: 2.062 u[IU]/mL (ref 0.350–4.500)

## 2013-07-21 ENCOUNTER — Other Ambulatory Visit (HOSPITAL_COMMUNITY)
Admission: RE | Admit: 2013-07-21 | Discharge: 2013-07-21 | Disposition: A | Discharge status: Home / Self Care | Payer: Medicare Other | Source: Ambulatory Visit | Attending: Internal Medicine | Admitting: Internal Medicine

## 2013-07-21 ENCOUNTER — Ambulatory Visit (INDEPENDENT_AMBULATORY_CARE_PROVIDER_SITE_OTHER): Payer: Medicare Other | Admitting: Internal Medicine

## 2013-07-21 ENCOUNTER — Encounter: Payer: Self-pay | Admitting: Internal Medicine

## 2013-07-21 VITALS — BP 128/74 | HR 80 | Temp 98.8°F | Ht 64.0 in | Wt 143.0 lb

## 2013-07-21 DIAGNOSIS — H9190 Unspecified hearing loss, unspecified ear: Secondary | ICD-10-CM | POA: Diagnosis not present

## 2013-07-21 DIAGNOSIS — J309 Allergic rhinitis, unspecified: Secondary | ICD-10-CM

## 2013-07-21 DIAGNOSIS — F411 Generalized anxiety disorder: Secondary | ICD-10-CM | POA: Diagnosis not present

## 2013-07-21 DIAGNOSIS — Z8669 Personal history of other diseases of the nervous system and sense organs: Secondary | ICD-10-CM

## 2013-07-21 DIAGNOSIS — Z0000 Encounter for general adult medical examination without abnormal findings: Secondary | ICD-10-CM

## 2013-07-21 DIAGNOSIS — Z124 Encounter for screening for malignant neoplasm of cervix: Secondary | ICD-10-CM | POA: Diagnosis not present

## 2013-07-21 DIAGNOSIS — G4700 Insomnia, unspecified: Secondary | ICD-10-CM | POA: Diagnosis not present

## 2013-07-21 DIAGNOSIS — I10 Essential (primary) hypertension: Secondary | ICD-10-CM

## 2013-07-21 LAB — POCT URINALYSIS DIPSTICK
Bilirubin, UA: NEGATIVE
Blood, UA: NEGATIVE
Glucose, UA: NEGATIVE
Ketones, UA: NEGATIVE
LEUKOCYTES UA: NEGATIVE
NITRITE UA: NEGATIVE
PH UA: 7
PROTEIN UA: NEGATIVE
Spec Grav, UA: <=1.005
UROBILINOGEN UA: NEGATIVE

## 2013-07-21 MED ORDER — FLUTICASONE PROPIONATE 50 MCG/ACT NA SUSP: 1.0000 | Freq: Every day | NASAL | Status: DC

## 2013-07-21 NOTE — Progress Notes (Signed)
Subjective:    Patient ID: Amber Garrett, female    DOB: [birth year], 73 y.o.   MRN: [id number]  HPI  73 year old White female for health maintenance and evaluation of medical issues history of hypertension treated with lisinopril. History of insomnia and anxiety. History of allergic rhinitis treated with Flonase. History of hearing loss. 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 History of osteoarthritis, history of sciatica, history of osteopenia.  Past medical history: Possible allergy to Macrodantin because it caused a rash on arms only. History of cesarean section and incisional hernia with appendectomy in 1973. Serious section in [place]. Endometrial polyp, endometrial ablation, resection of submucous myoma 1998. Colonoscopy done in 2007 showing only diverticulosis. Repeat study recommended in 10 years.  Patient had 2-D echocardiogram in 2006 to evaluate cardiac murmur. She had moderate tricuspid regurgitation with elevation of right ventricular systolic pressure at [id number] mm consistent with mild pulmonary hypertension. Mitral valve leaflets were thickened but opened well with no stenosis. There was trace mitral regurgitation.  Bone density study January 2012 showed osteopenia.  Social history: She is a Gaffer. Does not smoke. Social alcohol consumption. She does regular training and walking. Husband is a retired Forensic psychologist who has multiple sclerosis and now dementia.  Family history: Father died at age 42 of pulmonary fibrosis. Mother with history of hypertension and dementia. One brother in good health. A son and daughter in good health.    Review of Systems  Constitutional: Negative.   HENT:       History of hearing loss  Respiratory: Negative.   Cardiovascular:       History of moderate tricuspid regurgitation and mild pulmonary hypertension  Gastrointestinal: Negative.   Neurological:       History of sciatica  Psychiatric/Behavioral:       Anxiety and  mild depression due to husband's illness       Objective:   Physical Exam  Vitals reviewed. Constitutional: She is oriented to person, place, and time. She appears well-developed and well-nourished. No distress.  HENT:  Head: Normocephalic and atraumatic.  Right Ear: External ear normal.  Left Ear: External ear normal.  Mouth/Throat: Oropharynx is clear and moist. No oropharyngeal exudate.  Eyes: Conjunctivae and EOM are normal. Pupils are equal, round, and reactive to light. Right eye exhibits no discharge. Left eye exhibits no discharge. No scleral icterus.  Neck: Neck supple. No JVD present. No thyromegaly present.  Cardiovascular: Normal rate, regular rhythm and intact distal pulses.   Murmur heard. 1/6 systolic ejection murmur  Pulmonary/Chest: Effort normal and breath sounds normal. No respiratory distress. She has no wheezes. She has no rales.  Breasts normal female  Abdominal: Soft. Bowel sounds are normal. She exhibits no distension and no mass. There is no tenderness. There is no rebound and no guarding.  Genitourinary: Vagina normal. No vaginal discharge found.  Pap taken. Bimanual normal.  Musculoskeletal: Normal range of motion. She exhibits no edema.  Lymphadenopathy:    She has no cervical adenopathy.  Neurological: She is alert and oriented to person, place, and time. She has normal reflexes. No cranial nerve deficit. Coordination normal.  Skin: Skin is warm and dry. No rash noted. She is not diaphoretic.  Psychiatric: She has a normal mood and affect. Her behavior is normal. Judgment and thought content normal.          Assessment & Plan:  Hypertension-well controlled  Anxiety related to husband's illness  History of insomnia  Allergic  rhinitis  History of back pain and sciatica  History of hearing loss  Plan: Continue same medications return in 6 months    Subjective:   Patient presents for Medicare Annual/Subsequent preventive examination.    Review Past Medical/Family/Social: see above   Risk Factors  Current exercise habits: exercises 5 days a week at gym Dietary issues discussed: low fat low carb  Cardiac risk factors:htn  Depression Screen  (Note: if answer to either of the following is "Yes", a more complete depression screening is indicated)   Over the past two weeks, have you felt down, depressed or hopeless? yes Over the past two weeks, have you felt little interest or pleasure in doing things? No Have you lost interest or pleasure in daily life? No Do you often feel hopeless? No Do you cry easily over simple problems? No   Activities of Daily Living  In your present state of health, do you have any difficulty performing the following activities?:   Driving? No  Managing money? No  Feeding yourself? No  Getting from bed to chair? No  Climbing a flight of stairs? No  Preparing food and eating?: No  Bathing or showering? No  Getting dressed: No  Getting to the toilet? No  Using the toilet:No  Moving around from place to place: No  In the past year have you fallen or had a near fall?:No  Are you sexually active? No  Do you have more than one partner? No   Hearing Difficulties: No  Do you often ask people to speak up or repeat themselves? yes Do you experience ringing or noises in your ears? No  Do you have difficulty understanding soft or whispered voices? Yes- hx hearing loss Do you feel that you have a problem with memory? No Do you often misplace items? No    Home Safety:  Do you have a smoke alarm at your residence? Yes Do you have grab bars in the bathroom? yes Do you have throw rugs in your house?yes   Cognitive Testing  Alert? Yes Normal Appearance?Yes  Oriented to person? Yes Place? Yes  Time? Yes  Recall of three objects? Yes  Can perform simple calculations? Yes  Displays appropriate judgment?Yes  Can read the correct time from a watch face?Yes   List the Names of Other  Physician/Practitioners you currently use:  See referral list for the physicians patient is currently seeing. Dr. Bing Plume had cataract extraction August 2014 and October 2014. Last Pap 2012    Review of Systems: See above   Objective:     General appearance: WDWN pleasant female.  Head: Normocephalic, without obvious abnormality, atraumatic  Eyes: conj clear, EOMi PEERLA  Ears: normal TM's and external ear canals both ears  Nose: Nares normal. Septum midline. Mucosa normal. No drainage or sinus tenderness.  Throat: lips, mucosa, and tongue normal; teeth and gums normal  Neck: no adenopathy, no carotid bruit, no JVD, supple, symmetrical, trachea midline and thyroid not enlarged, symmetric, no tenderness/mass/nodules  No CVA tenderness.  Lungs: clear to auscultation bilaterally  Breasts: normal appearance, no masses or tenderness Heart: regular rate and rhythm, S1, S2 normal, no murmur, click, rub or gallop  Abdomen: soft, non-tender; bowel sounds normal; no masses, no organomegaly  Musculoskeletal: ROM normal in all joints, no crepitus, no deformity, Normal muscle strengthen. Back  is symmetric, no curvature. Skin: Skin color, texture, turgor normal. No rashes or lesions  Lymph nodes: Cervical, supraclavicular, and axillary nodes normal.  Neurologic: CN 2 -  12 Normal, Normal symmetric reflexes. Normal coordination and gait  Psych: Alert & Oriented x 3, Mood appear stable.    Assessment:    Annual wellness medicare exam   Plan:    During the course of the visit the patient was educated and counseled about appropriate screening and preventive services including:   Annual flu vaccine  Annual mammogram     Patient Instructions (the written plan) was given to the patient.  Medicare Attestation  I have personally reviewed:  The patient's medical and social history  Their use of alcohol, tobacco or illicit drugs  Their current medications and supplements  The patient's  functional ability including ADLs,fall risks, home safety risks, cognitive, and hearing and visual impairment  Diet and physical activities  Evidence for depression or mood disorders  The patient's weight, height, BMI, and visual acuity have been recorded in the chart. I have made referrals, counseling, and provided education to the patient based on review of the above and I have provided the patient with a written personalized care plan for preventive services.

## 2013-07-22 ENCOUNTER — Encounter: Payer: Medicare Other | Admitting: Internal Medicine

## 2013-08-25 ENCOUNTER — Ambulatory Visit (INDEPENDENT_AMBULATORY_CARE_PROVIDER_SITE_OTHER): Payer: Medicare Other | Admitting: Internal Medicine

## 2013-08-25 ENCOUNTER — Encounter: Payer: Self-pay | Admitting: Internal Medicine

## 2013-08-25 VITALS — BP 126/84 | HR 84 | Wt 142.0 lb

## 2013-08-25 DIAGNOSIS — F32A Depression, unspecified: Secondary | ICD-10-CM

## 2013-08-25 DIAGNOSIS — F329 Major depressive disorder, single episode, unspecified: Secondary | ICD-10-CM

## 2013-08-25 DIAGNOSIS — F411 Generalized anxiety disorder: Secondary | ICD-10-CM

## 2013-08-25 DIAGNOSIS — F3289 Other specified depressive episodes: Secondary | ICD-10-CM | POA: Diagnosis not present

## 2013-08-25 DIAGNOSIS — I10 Essential (primary) hypertension: Secondary | ICD-10-CM

## 2013-08-25 DIAGNOSIS — F4321 Adjustment disorder with depressed mood: Secondary | ICD-10-CM

## 2013-08-25 MED ORDER — SERTRALINE HCL 50 MG PO TABS: 50.0000 mg | ORAL_TABLET | Freq: Every day | ORAL | Status: DC

## 2013-08-25 NOTE — Progress Notes (Signed)
   Subjective:    Patient ID: Amber Garrett, female    DOB: [birth year], 73 y.o.   MRN: [id number]  HPI  Complains of some sadness for several days. Husband has dementia and multiple sclerosis. Is a bit tearful in the office today. Does have a counselor she can speak with  but I think she is grieving loss of husband's health. She was able to go to [place] to visit her daughter recently. Husband stayed in assisted living at [facility] during that time. She is really not ready to move out later. Would like to keep her husband at home as long as she can. Has some irritability. Is trying to make time to do some things for herself. Takes Klonopin for anxiety. Says father had history of depression   Review of Systems     Objective:   Physical Exam  Spent 25 minutes speaking with patient about these issues. She would like to try Zoloft.      Assessment & Plan:  Anxiety  Depression  Plan: Zoloft 50 mg daily. Return in 4 weeks for recheck. Side effects discussed.

## 2013-08-25 NOTE — Patient Instructions (Addendum)
Start Zoloft 50 mg daily and return in 4 weeks. Continue Klonopin.

## 2013-09-27 ENCOUNTER — Ambulatory Visit: Payer: Medicare Other | Admitting: Internal Medicine

## 2013-09-27 ENCOUNTER — Telehealth: Payer: Self-pay | Admitting: Internal Medicine

## 2013-09-27 NOTE — Telephone Encounter (Signed)
That she was having when she was last seen in our office.  Wants to know if she HAS to see you Thursday in follow up?  If not, she would like to cancel her appointment r/t she's doing well at this time.  Advised patient that we normally follow up on new medications to see how they are working, etc.  In addition, we like to follow up on medications for the purposes of refilling them for long-term use as well.  If you DO want to see patient, she will R/S Thursday's appointment (5/28) as this is not a good time for her.  Please advise if you want to see her or not.

## 2013-09-27 NOTE — Telephone Encounter (Signed)
Spoke with patient and advised that may cancel her appointment if she so desires.  Patient WILL cancel 5/28.  Once again states she's fine.  Instructed patient to please call our office should she need [place] for anything.

## 2013-09-27 NOTE — Telephone Encounter (Signed)
She may continue with Zoloft 50 and cancel her appt if she desires.

## 2013-09-29 ENCOUNTER — Ambulatory Visit: Payer: Medicare Other | Admitting: Internal Medicine

## 2013-10-03 ENCOUNTER — Encounter: Payer: Self-pay | Admitting: Internal Medicine

## 2013-10-03 ENCOUNTER — Ambulatory Visit (INDEPENDENT_AMBULATORY_CARE_PROVIDER_SITE_OTHER): Payer: Medicare Other | Admitting: Internal Medicine

## 2013-10-03 VITALS — BP 114/78 | HR 80 | Temp 98.5°F | Wt 140.0 lb

## 2013-10-03 DIAGNOSIS — J069 Acute upper respiratory infection, unspecified: Secondary | ICD-10-CM

## 2013-10-03 DIAGNOSIS — B9789 Other viral agents as the cause of diseases classified elsewhere: Secondary | ICD-10-CM | POA: Diagnosis not present

## 2013-10-03 DIAGNOSIS — B349 Viral infection, unspecified: Secondary | ICD-10-CM

## 2013-10-03 MED ORDER — AZITHROMYCIN 250 MG PO TABS
ORAL_TABLET | ORAL | Status: DC
Start: 2013-10-03 — End: 2014-03-07

## 2013-10-03 MED ORDER — HYDROCODONE-HOMATROPINE 5-1.5 MG/5ML PO SYRP: 5.0000 mL | ORAL_SOLUTION | Freq: Three times a day (TID) | ORAL | Status: DC | PRN

## 2013-10-03 NOTE — Progress Notes (Signed)
   Subjective:    Patient ID: Amber Garrett, female    DOB: [birth year], 73 y.o.   MRN: [id number]  HPI URI symptoms for a couple of days. Awakened yesterday with malaise fatigue but no fever or shaking chills. Has sore throat and cough. Has had pain in chest from coughing. Sputum slightly discolored at times. She was worried about pneumonia because of the malaise.    Review of Systems     Objective:   Physical Exam She sounds hoarse when she speaks. Wears hearing aids. Pharynx slightly injected. TMs clear. Neck supple without adenopathy. Chest clear to auscultation without rales or wheezing       Assessment & Plan:  Acute URI  Viral syndrome  Plan: Zithromax Z-Pak take 2 tablets day one followed by 1 tablet days 2 through 5. Hycodan 8 ounces 1 teaspoon by mouth every 8 hours when necessary cough.

## 2013-10-03 NOTE — Patient Instructions (Signed)
Take Zithromax as directed. Take Hycodan as directed.

## 2013-11-07 DIAGNOSIS — F4323 Adjustment disorder with mixed anxiety and depressed mood: Secondary | ICD-10-CM | POA: Diagnosis not present

## 2013-12-05 ENCOUNTER — Other Ambulatory Visit: Payer: Self-pay | Admitting: Internal Medicine

## 2013-12-08 DIAGNOSIS — F4323 Adjustment disorder with mixed anxiety and depressed mood: Secondary | ICD-10-CM | POA: Diagnosis not present

## 2013-12-29 DIAGNOSIS — F4323 Adjustment disorder with mixed anxiety and depressed mood: Secondary | ICD-10-CM | POA: Diagnosis not present

## 2014-01-01 NOTE — Patient Instructions (Signed)
Continue same medications and return in 6 months 

## 2014-01-24 ENCOUNTER — Other Ambulatory Visit: Payer: Self-pay | Admitting: Internal Medicine

## 2014-01-24 NOTE — Telephone Encounter (Signed)
Refill x 6 months 

## 2014-01-25 NOTE — Telephone Encounter (Signed)
Clonazepam called into pharmacy 

## 2014-01-25 NOTE — Telephone Encounter (Signed)
Please refill x 6 months 

## 2014-01-25 NOTE — Telephone Encounter (Signed)
Please close this request out. Thanks

## 2014-01-26 DIAGNOSIS — F4323 Adjustment disorder with mixed anxiety and depressed mood: Secondary | ICD-10-CM | POA: Diagnosis not present

## 2014-02-09 DIAGNOSIS — H26493 Other secondary cataract, bilateral: Secondary | ICD-10-CM | POA: Diagnosis not present

## 2014-02-10 DIAGNOSIS — L710 Perioral dermatitis: Secondary | ICD-10-CM | POA: Diagnosis not present

## 2014-02-22 ENCOUNTER — Other Ambulatory Visit: Payer: Self-pay | Admitting: Internal Medicine

## 2014-02-22 NOTE — Telephone Encounter (Signed)
Refill x 6 months 

## 2014-02-23 DIAGNOSIS — F4322 Adjustment disorder with anxiety: Secondary | ICD-10-CM | POA: Diagnosis not present

## 2014-02-23 DIAGNOSIS — Z23 Encounter for immunization: Secondary | ICD-10-CM | POA: Diagnosis not present

## 2014-02-23 NOTE — Telephone Encounter (Signed)
Ambien called into pharmacy

## 2014-03-04 DIAGNOSIS — J40 Bronchitis, not specified as acute or chronic: Secondary | ICD-10-CM | POA: Diagnosis not present

## 2014-03-04 DIAGNOSIS — J029 Acute pharyngitis, unspecified: Secondary | ICD-10-CM | POA: Diagnosis not present

## 2014-03-04 DIAGNOSIS — J040 Acute laryngitis: Secondary | ICD-10-CM | POA: Diagnosis not present

## 2014-03-07 ENCOUNTER — Ambulatory Visit (INDEPENDENT_AMBULATORY_CARE_PROVIDER_SITE_OTHER): Payer: Medicare Other | Admitting: Internal Medicine

## 2014-03-07 ENCOUNTER — Encounter: Payer: Self-pay | Admitting: Internal Medicine

## 2014-03-07 VITALS — BP 102/70 | HR 84 | Temp 99.1°F | Wt 140.0 lb

## 2014-03-07 DIAGNOSIS — H109 Unspecified conjunctivitis: Secondary | ICD-10-CM | POA: Diagnosis not present

## 2014-03-07 DIAGNOSIS — H6503 Acute serous otitis media, bilateral: Secondary | ICD-10-CM

## 2014-03-07 DIAGNOSIS — J069 Acute upper respiratory infection, unspecified: Secondary | ICD-10-CM

## 2014-03-07 DIAGNOSIS — J040 Acute laryngitis: Secondary | ICD-10-CM | POA: Diagnosis not present

## 2014-03-07 MED ORDER — AMOXICILLIN-POT CLAVULANATE 500-125 MG PO TABS: 1.0000 | ORAL_TABLET | Freq: Three times a day (TID) | ORAL | Status: DC

## 2014-03-07 MED ORDER — AMOXICILLIN-POT CLAVULANATE 875-125 MG PO TABS: 1.0000 | ORAL_TABLET | Freq: Two times a day (BID) | ORAL | Status: DC

## 2014-03-07 MED ORDER — OFLOXACIN 0.3 % OP SOLN: 1.0000 [drp] | Freq: Four times a day (QID) | OPHTHALMIC | Status: DC

## 2014-03-07 NOTE — Patient Instructions (Signed)
Take Augmentin as directed 3 times daily for 10 days. Use Ocuflox ophthalmic drops 2 drops in each eye 4 times daily for 5 days. Take cough preparation as directed.

## 2014-03-07 NOTE — Progress Notes (Signed)
   Subjective:    Patient ID: Amber Garrett, female    DOB: 1940/08/10, 73 y.o.   MRN: [id number]  HPI  She went to visit her daughter and grandchildren in [place]. Children were sick. She came down with upper respiratory infection symptoms and laryngitis. Was seen by physician there and was treated with Tessalon Perles and hydrocodone cough syrup. No antibiotic was prescribed. Has had laryngitis now for 4 days. Finds it very inconvenient. Cannot talk on the phone. Husband is moving to assisted living at [facility] with history of dementia and multiple sclerosis. This is been stressful. Also today she noticed drainage from right eye which is new. No fever. No shaking chills. Cough is fear he slightly productive. Has had some sore throat. Was checked for strep in [place] and was told test was negative.    Review of Systems     Objective:   Physical Exam Pharynx is red without exudate. TMs are dull bilaterally. Neck is supple. Chest clear. She has laryngitis and can barely speak. She has conjunctivitis of right eye       Assessment & Plan:  Acute URI  Laryngitis  Conjunctivitis right eye  Acute bilateral serous otitis media  Plan: Augmentin 500 mg 3 times daily for 10 days. Ocuflox ophthalmic solution 2 drops in each eye 4 times a day for 5 days. Warm hot compresses to right eye 20 minutes twice a day. Wash pellet case and bath towel as well as washcloth daily. Drink plenty of fluids and get plenty of rest.

## 2014-03-14 ENCOUNTER — Encounter: Payer: Self-pay | Admitting: Internal Medicine

## 2014-04-03 DIAGNOSIS — F4322 Adjustment disorder with anxiety: Secondary | ICD-10-CM | POA: Diagnosis not present

## 2014-04-10 ENCOUNTER — Telehealth: Payer: Self-pay

## 2014-04-10 ENCOUNTER — Encounter: Payer: Self-pay | Admitting: Internal Medicine

## 2014-04-10 NOTE — Telephone Encounter (Signed)
Left message for patient to call and let us know if they have received flu vaccine.

## 2014-04-21 ENCOUNTER — Other Ambulatory Visit: Payer: Self-pay | Admitting: Internal Medicine

## 2014-07-03 DIAGNOSIS — Z1231 Encounter for screening mammogram for malignant neoplasm of breast: Secondary | ICD-10-CM | POA: Diagnosis not present

## 2014-07-14 ENCOUNTER — Encounter: Payer: Self-pay | Admitting: Internal Medicine

## 2014-10-18 ENCOUNTER — Other Ambulatory Visit: Payer: Self-pay | Admitting: Internal Medicine

## 2014-10-18 NOTE — Telephone Encounter (Signed)
Patient scheduled appt for CPE ambien refilled for 30 day supply

## 2014-10-18 NOTE — Telephone Encounter (Signed)
Past due for CPE please call

## 2014-10-18 NOTE — Telephone Encounter (Signed)
Refill once 

## 2014-11-10 ENCOUNTER — Other Ambulatory Visit: Payer: Medicare Other | Admitting: Internal Medicine

## 2014-11-10 DIAGNOSIS — E559 Vitamin D deficiency, unspecified: Secondary | ICD-10-CM | POA: Diagnosis not present

## 2014-11-10 DIAGNOSIS — Z136 Encounter for screening for cardiovascular disorders: Principal | ICD-10-CM

## 2014-11-10 DIAGNOSIS — Z79899 Other long term (current) drug therapy: Secondary | ICD-10-CM | POA: Diagnosis not present

## 2014-11-10 DIAGNOSIS — Z13220 Encounter for screening for lipoid disorders: Secondary | ICD-10-CM | POA: Diagnosis not present

## 2014-11-10 DIAGNOSIS — R5383 Other fatigue: Secondary | ICD-10-CM

## 2014-11-10 LAB — CBC WITH DIFFERENTIAL/PLATELET
BASOS PCT: 1 % (ref 0–1)
Basophils Absolute: 0.1 10*3/uL (ref 0.0–0.1)
EOS ABS: 0.2 10*3/uL (ref 0.0–0.7)
Eosinophils Relative: 3 % (ref 0–5)
HCT: 42.8 % (ref 36.0–46.0)
Hemoglobin: 14.5 g/dL (ref 12.0–15.0)
Lymphocytes Relative: 40 % (ref 12–46)
Lymphs Abs: 2.2 10*3/uL (ref 0.7–4.0)
MCH: 32.3 pg (ref 26.0–34.0)
MCHC: 33.9 g/dL (ref 30.0–36.0)
MCV: 95.3 fL (ref 78.0–100.0)
MONOS PCT: 11 % (ref 3–12)
MPV: 10.3 fL (ref 8.6–12.4)
Monocytes Absolute: 0.6 10*3/uL (ref 0.1–1.0)
NEUTROS ABS: 2.4 10*3/uL (ref 1.7–7.7)
Neutrophils Relative %: 45 % (ref 43–77)
PLATELETS: 260 10*3/uL (ref 150–400)
RBC: 4.49 MIL/uL (ref 3.87–5.11)
RDW: 13.2 % (ref 11.5–15.5)
WBC: 5.4 10*3/uL (ref 4.0–10.5)

## 2014-11-10 LAB — LIPID PANEL
CHOL/HDL RATIO: 2.7 ratio
Cholesterol: 220 mg/dL — ABNORMAL HIGH (ref 0–200)
HDL: 82 mg/dL (ref >=46)
LDL Cholesterol: 124 mg/dL — ABNORMAL HIGH (ref 0–99)
Triglycerides: 68 mg/dL (ref <150)
VLDL: 14 mg/dL (ref 0–40)

## 2014-11-10 LAB — COMPLETE METABOLIC PANEL WITH GFR
ALT: 12 U/L (ref 0–35)
AST: 19 U/L (ref 0–37)
Albumin: 4.3 g/dL (ref 3.5–5.2)
Alkaline Phosphatase: 58 U/L (ref 39–117)
BILIRUBIN TOTAL: 0.5 mg/dL (ref 0.2–1.2)
BUN: 20 mg/dL (ref 6–23)
CALCIUM: 9.5 mg/dL (ref 8.4–10.5)
CHLORIDE: 101 meq/L (ref 96–112)
CO2: 24 mEq/L (ref 19–32)
Creat: 0.88 mg/dL (ref 0.50–1.10)
GFR, Est African American: 76 mL/min
GFR, Est Non African American: 66 mL/min
Glucose, Bld: 92 mg/dL (ref 70–99)
Potassium: 4.6 mEq/L (ref 3.5–5.3)
Sodium: 140 mEq/L (ref 135–145)
Total Protein: 7.1 g/dL (ref 6.0–8.3)

## 2014-11-10 LAB — TSH: TSH: 2.289 u[IU]/mL (ref 0.350–4.500)

## 2014-11-11 LAB — VITAMIN D 25 HYDROXY (VIT D DEFICIENCY, FRACTURES): VIT D 25 HYDROXY: 35 ng/mL (ref 30–100)

## 2014-11-16 ENCOUNTER — Encounter: Payer: Self-pay | Admitting: Internal Medicine

## 2014-11-16 ENCOUNTER — Ambulatory Visit (INDEPENDENT_AMBULATORY_CARE_PROVIDER_SITE_OTHER): Payer: Medicare Other | Admitting: Internal Medicine

## 2014-11-16 VITALS — BP 104/68 | HR 81 | Temp 97.8°F | Ht 64.0 in | Wt 152.0 lb

## 2014-11-16 DIAGNOSIS — Z0000 Encounter for general adult medical examination without abnormal findings: Secondary | ICD-10-CM | POA: Diagnosis not present

## 2014-11-16 DIAGNOSIS — H9190 Unspecified hearing loss, unspecified ear: Secondary | ICD-10-CM | POA: Diagnosis not present

## 2014-11-16 DIAGNOSIS — I10 Essential (primary) hypertension: Secondary | ICD-10-CM | POA: Diagnosis not present

## 2014-11-16 DIAGNOSIS — J309 Allergic rhinitis, unspecified: Secondary | ICD-10-CM | POA: Diagnosis not present

## 2014-11-16 DIAGNOSIS — I270 Primary pulmonary hypertension: Secondary | ICD-10-CM

## 2014-11-16 DIAGNOSIS — I071 Rheumatic tricuspid insufficiency: Secondary | ICD-10-CM

## 2014-11-16 DIAGNOSIS — F411 Generalized anxiety disorder: Secondary | ICD-10-CM

## 2014-11-16 DIAGNOSIS — M5430 Sciatica, unspecified side: Secondary | ICD-10-CM

## 2014-11-16 DIAGNOSIS — M8580 Other specified disorders of bone density and structure, unspecified site: Secondary | ICD-10-CM | POA: Diagnosis not present

## 2014-11-16 DIAGNOSIS — G4700 Insomnia, unspecified: Secondary | ICD-10-CM

## 2014-11-16 DIAGNOSIS — I272 Pulmonary hypertension, unspecified: Secondary | ICD-10-CM

## 2014-11-16 LAB — POCT URINALYSIS DIPSTICK
BILIRUBIN UA: NEGATIVE
Glucose, UA: NEGATIVE
Ketones, UA: NEGATIVE
LEUKOCYTES UA: NEGATIVE
Nitrite, UA: NEGATIVE
Protein, UA: NEGATIVE
RBC UA: NEGATIVE
Spec Grav, UA: 1.015
Urobilinogen, UA: NEGATIVE
pH, UA: 6

## 2014-11-16 MED ORDER — LISINOPRIL 5 MG PO TABS: ORAL_TABLET | ORAL | Status: DC

## 2014-11-16 MED ORDER — ZOLPIDEM TARTRATE 10 MG PO TABS: ORAL_TABLET | ORAL | Status: DC

## 2014-11-16 MED ORDER — SERTRALINE HCL 50 MG PO TABS: 50.0000 mg | ORAL_TABLET | Freq: Every day | ORAL | Status: DC

## 2014-12-03 ENCOUNTER — Encounter: Payer: Self-pay | Admitting: Internal Medicine

## 2014-12-03 NOTE — Patient Instructions (Signed)
It was a pleasure to see you today. Continue same medications and return in 12 months or as needed.

## 2014-12-03 NOTE — Progress Notes (Signed)
Subjective:    Patient ID: Amber Garrett, female    DOB: 11-23-1940, 74 y.o.   MRN: [id number]  HPI  74 year old female in today for health maintenance exam and evaluation of medical problems. History of hypertension, insomnia, anxiety, allergic rhinitis, hearing loss, positive PPD with negative chest x-ray, osteoarthritis. History of sciatica and osteopenia.  Past medical history: Possible allergy to Macrodantin because it caused a rash on arms only. History of C-section and incisional hernia with appendectomy 1973. C-section in [place] in 1970. Endometrial polyp, endometrial ablation, resection of submucous myoma 1998. Colonoscopy done in 2007 showing only diverticulosis. Repeat study recommended in 10 years.  Patient had 2-D echocardiogram in 2006 to evaluate cardiac murmur. She had moderate tricuspid regurgitation with elevation of right ventricular systolic pressure at 09-98 mm consistent with mild pulmonary hypertension. Mitral valve leaflets were thickened but opened well with no stenosis. Trace mitral regurgitation.  Bone density study January 2012 showed osteopenia.  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 Social alcohol consumption. She exercises regularly. Husband is retired Forensic psychologist who has multiple sclerosis and dementia who now resides at [facility]. Patient has been traveling in [place] recently. She does see him frequently.  Family history: Father died at age 28 with pulmonary fibrosis. Mother died with history of hypertension, heart issues, bradycardia, C. difficile and MRSA. One brother in good health. A son and daughter in good health.  Cataract extractions by Dr. Bing Plume August 2014 in October 2014. Last Pap 2012.  Review of Systems  Constitutional: Negative.   All other systems reviewed and are negative.      Objective:   Physical Exam  Constitutional: She is oriented to person, place, and time. She appears well-developed and well-nourished. No  distress.  HENT:  Head: Normocephalic and atraumatic.  Right Ear: External ear normal.  Left Ear: External ear normal.  Mouth/Throat: Oropharynx is clear and moist. No oropharyngeal exudate.  Eyes: Conjunctivae and EOM are normal. Pupils are equal, round, and reactive to light. Right eye exhibits no discharge. Left eye exhibits no discharge. No scleral icterus.  Neck: Neck supple. No JVD present. No thyromegaly present.  Cardiovascular: Normal rate and regular rhythm.   Murmur heard. 1/6 systolic ejection murmur  Abdominal: Soft. Bowel sounds are normal. She exhibits no distension and no mass. There is no rebound and no guarding.  Musculoskeletal: Normal range of motion. She exhibits no edema.  Lymphadenopathy:    She has no cervical adenopathy.  Neurological: She is alert and oriented to person, place, and time. She has normal reflexes. No cranial nerve deficit. Coordination normal.  Skin: Skin is warm and dry. No rash noted. She is not diaphoretic.  Psychiatric: She has a normal mood and affect. Her behavior is normal. Judgment and thought content normal.  Vitals reviewed.         Assessment & Plan:  Social hypertension-stable  History of cardiac murmur-found to have moderate tricuspid regurgitation and mild pulmonary hypertension on 2-D echo 2006  Osteopenia-bone density study January 2012  Anxiety  Insomnia  Hearing loss  Positive PPD with negative chest x-ray  Allergic rhinitis  Osteoarthritis  History of sciatica  Plan: Patient doing well and may return in 12 months or as needed.  Subjective:   Patient presents for Medicare Annual/Subsequent preventive examination.  Review Past Medical/Family/Social:   Risk Factors  Current exercise habits:  Dietary issues discussed:   Cardiac risk factors:  Depression Screen  (Note: if answer to either of the  following is "Yes", a more complete depression screening is indicated)   Over the past two weeks, have you  felt down, depressed or hopeless? No  Over the past two weeks, have you felt little interest or pleasure in doing things? No Have you lost interest or pleasure in daily life? No Do you often feel hopeless? No Do you cry easily over simple problems? No   Activities of Daily Living  In your present state of health, do you have any difficulty performing the following activities?:   Driving? No  Managing money? No  Feeding yourself? No  Getting from bed to chair? No  Climbing a flight of stairs? No  Preparing food and eating?: No  Bathing or showering? No  Getting dressed: No  Getting to the toilet? No  Using the toilet:No  Moving around from place to place: No  In the past year have you fallen or had a near fall?:No  Are you sexually active? No  Do you have more than one partner? No   Hearing Difficulties: No  Do you often ask people to speak up or repeat themselves? yesDo you experience ringing or noises in your ears? No  Do you have difficulty understanding soft or whispered voices? yes Do you feel that you have a problem with memory? No Do you often misplace items? No    Home Safety:  Do you have a smoke alarm at your residence? Yes Do you have grab bars in the bathroom? yes Do you have throw rugs in your house? yes   Cognitive Testing  Alert? Yes Normal Appearance?Yes  Oriented to person? Yes Place? Yes  Time? Yes  Recall of three objects? Yes  Can perform simple calculations? Yes  Displays appropriate judgment?Yes  Can read the correct time from a watch face?Yes   List the Names of Other Physician/Practitioners you currently use:  See referral list for the physicians patient is currently seeing.     Review of Systems: See above   Objective:     General appearance: Appears stated age  Head: Normocephalic, without obvious abnormality, atraumatic  Eyes: conj clear, EOMi PEERLA  Ears: normal TM's and external ear canals both ears  Nose: Nares normal. Septum  midline. Mucosa normal. No drainage or sinus tenderness.  Throat: lips, mucosa, and tongue normal; teeth and gums normal  Neck: no adenopathy, no carotid bruit, no JVD, supple, symmetrical, trachea midline and thyroid not enlarged, symmetric, no tenderness/mass/nodules  No CVA tenderness.  Lungs: clear to auscultation bilaterally  Breasts: normal appearance, no masses or tenderness Heart: regular rate and rhythm, S1, S2 normal, no murmur, click, rub or gallop  Abdomen: soft, non-tender; bowel sounds normal; no masses, no organomegaly  Musculoskeletal: ROM normal in all joints, no crepitus, no deformity, Normal muscle strengthen. Back  is symmetric, no curvature. Skin: Skin color, texture, turgor normal. No rashes or lesions  Lymph nodes: Cervical, supraclavicular, and axillary nodes normal.  Neurologic: CN 2 -12 Normal, Normal symmetric reflexes. Normal coordination and gait  Psych: Alert & Oriented x 3, Mood appear stable.    Assessment:    Annual wellness medicare exam   Plan:    During the course of the visit the patient was educated and counseled about appropriate screening and preventive services including:   Annual mammogram     Patient Instructions (the written plan) was given to the patient.  Medicare Attestation  I have personally reviewed:  The patient's medical and social history  Their use of alcohol,  tobacco or illicit drugs  Their current medications and supplements  The patient's functional ability including ADLs,fall risks, home safety risks, cognitive, and hearing and visual impairment  Diet and physical activities  Evidence for depression or mood disorders  The patient's weight, height, BMI, and visual acuity have been recorded in the chart. I have made referrals, counseling, and provided education to the patient based on review of the above and I have provided the patient with a written personalized care plan for preventive services.

## 2015-02-20 DIAGNOSIS — H43813 Vitreous degeneration, bilateral: Secondary | ICD-10-CM | POA: Diagnosis not present

## 2015-02-20 DIAGNOSIS — H26491 Other secondary cataract, right eye: Secondary | ICD-10-CM | POA: Diagnosis not present

## 2015-02-20 DIAGNOSIS — H5213 Myopia, bilateral: Secondary | ICD-10-CM | POA: Diagnosis not present

## 2015-02-20 DIAGNOSIS — H524 Presbyopia: Secondary | ICD-10-CM | POA: Diagnosis not present

## 2015-02-20 DIAGNOSIS — H52223 Regular astigmatism, bilateral: Secondary | ICD-10-CM | POA: Diagnosis not present

## 2015-03-22 ENCOUNTER — Ambulatory Visit (INDEPENDENT_AMBULATORY_CARE_PROVIDER_SITE_OTHER): Payer: Medicare Other | Admitting: Internal Medicine

## 2015-03-22 VITALS — BP 130/72

## 2015-03-22 DIAGNOSIS — Z23 Encounter for immunization: Secondary | ICD-10-CM

## 2015-05-28 ENCOUNTER — Other Ambulatory Visit: Payer: Self-pay | Admitting: Internal Medicine

## 2015-06-25 ENCOUNTER — Other Ambulatory Visit: Payer: Self-pay | Admitting: Internal Medicine

## 2015-06-25 NOTE — Telephone Encounter (Signed)
Phoned to pharmacy 

## 2015-06-25 NOTE — Telephone Encounter (Signed)
Refill through July- CPE due July

## 2015-07-04 DIAGNOSIS — Z1231 Encounter for screening mammogram for malignant neoplasm of breast: Secondary | ICD-10-CM | POA: Diagnosis not present

## 2015-07-04 DIAGNOSIS — M85851 Other specified disorders of bone density and structure, right thigh: Secondary | ICD-10-CM | POA: Diagnosis not present

## 2015-07-04 LAB — HM DEXA SCAN

## 2015-07-24 DIAGNOSIS — L710 Perioral dermatitis: Secondary | ICD-10-CM | POA: Diagnosis not present

## 2015-07-24 DIAGNOSIS — L218 Other seborrheic dermatitis: Secondary | ICD-10-CM | POA: Diagnosis not present

## 2015-08-08 DIAGNOSIS — D2212 Melanocytic nevi of left eyelid, including canthus: Secondary | ICD-10-CM | POA: Diagnosis not present

## 2015-08-08 DIAGNOSIS — L814 Other melanin hyperpigmentation: Secondary | ICD-10-CM | POA: Diagnosis not present

## 2015-08-08 DIAGNOSIS — L821 Other seborrheic keratosis: Secondary | ICD-10-CM | POA: Diagnosis not present

## 2015-08-08 DIAGNOSIS — D2272 Melanocytic nevi of left lower limb, including hip: Secondary | ICD-10-CM | POA: Diagnosis not present

## 2015-08-08 DIAGNOSIS — D2239 Melanocytic nevi of other parts of face: Secondary | ICD-10-CM | POA: Diagnosis not present

## 2015-08-08 DIAGNOSIS — D225 Melanocytic nevi of trunk: Secondary | ICD-10-CM | POA: Diagnosis not present

## 2015-08-08 DIAGNOSIS — L710 Perioral dermatitis: Secondary | ICD-10-CM | POA: Diagnosis not present

## 2015-12-06 ENCOUNTER — Ambulatory Visit: Payer: Medicare Other | Admitting: Internal Medicine

## 2015-12-10 ENCOUNTER — Ambulatory Visit (INDEPENDENT_AMBULATORY_CARE_PROVIDER_SITE_OTHER): Payer: Medicare Other | Admitting: Internal Medicine

## 2015-12-10 ENCOUNTER — Encounter: Payer: Self-pay | Admitting: Internal Medicine

## 2015-12-10 ENCOUNTER — Telehealth: Payer: Self-pay

## 2015-12-10 VITALS — BP 112/72 | HR 75 | Temp 97.3°F | Ht 64.0 in | Wt 154.0 lb

## 2015-12-10 DIAGNOSIS — K589 Irritable bowel syndrome without diarrhea: Secondary | ICD-10-CM

## 2015-12-10 DIAGNOSIS — R0781 Pleurodynia: Secondary | ICD-10-CM

## 2015-12-10 DIAGNOSIS — G8929 Other chronic pain: Secondary | ICD-10-CM

## 2015-12-10 DIAGNOSIS — K580 Irritable bowel syndrome with diarrhea: Secondary | ICD-10-CM | POA: Diagnosis not present

## 2015-12-10 DIAGNOSIS — M5441 Lumbago with sciatica, right side: Secondary | ICD-10-CM | POA: Diagnosis not present

## 2015-12-10 DIAGNOSIS — R140 Abdominal distension (gaseous): Secondary | ICD-10-CM

## 2015-12-10 DIAGNOSIS — R1031 Right lower quadrant pain: Secondary | ICD-10-CM

## 2015-12-10 MED ORDER — HYOSCYAMINE SULFATE 0.125 MG PO TABS: ORAL_TABLET | ORAL | 0 refills | Status: DC

## 2015-12-10 NOTE — Patient Instructions (Addendum)
To have CT of abdomen and pelvis for abdominal bloating. Try Levsin sublingual before meals and at bedtime for irritable bowel symptoms. Patient is to call Dr. Osborn Coho office regarding repeat colonoscopy.  Have right rib x-ray and LS-spine films. Try physical therapy.   For fasting labs in the near future.  Return for follow-up in approximately 3 weeks.

## 2015-12-10 NOTE — Telephone Encounter (Signed)
Called patient to give scheduled CT info. Patient states unable to make date. Provided GI phone number to call and reschedule.

## 2015-12-11 ENCOUNTER — Telehealth: Payer: Self-pay | Admitting: Internal Medicine

## 2015-12-11 NOTE — Telephone Encounter (Signed)
Amber Garrett called saying she was seen by Dr. Renold Genta on 8.7.17 and was told an order for her to have a chest xray and an xray of her ribs would be placed. She's wondering if it's been placed and where she needs to go. Please give her a phone call regarding this.  Pt's ph# [phone] Thank you.

## 2015-12-12 ENCOUNTER — Ambulatory Visit
Admission: RE | Admit: 2015-12-12 | Discharge: 2015-12-12 | Disposition: A | Discharge status: Home / Self Care | Payer: Medicare Other | Source: Ambulatory Visit | Attending: Internal Medicine | Admitting: Internal Medicine

## 2015-12-12 ENCOUNTER — Other Ambulatory Visit: Payer: Self-pay | Admitting: Internal Medicine

## 2015-12-12 DIAGNOSIS — M5441 Lumbago with sciatica, right side: Secondary | ICD-10-CM

## 2015-12-12 DIAGNOSIS — R0781 Pleurodynia: Secondary | ICD-10-CM

## 2015-12-12 DIAGNOSIS — M47816 Spondylosis without myelopathy or radiculopathy, lumbar region: Secondary | ICD-10-CM | POA: Diagnosis not present

## 2015-12-12 NOTE — Telephone Encounter (Signed)
X-rays have been ordered.

## 2015-12-14 ENCOUNTER — Other Ambulatory Visit (INDEPENDENT_AMBULATORY_CARE_PROVIDER_SITE_OTHER): Payer: Medicare Other | Admitting: Internal Medicine

## 2015-12-14 DIAGNOSIS — Z0000 Encounter for general adult medical examination without abnormal findings: Secondary | ICD-10-CM | POA: Diagnosis not present

## 2015-12-14 DIAGNOSIS — R8290 Unspecified abnormal findings in urine: Secondary | ICD-10-CM | POA: Diagnosis not present

## 2015-12-14 DIAGNOSIS — F419 Anxiety disorder, unspecified: Secondary | ICD-10-CM | POA: Diagnosis not present

## 2015-12-14 DIAGNOSIS — I10 Essential (primary) hypertension: Secondary | ICD-10-CM

## 2015-12-14 DIAGNOSIS — M8580 Other specified disorders of bone density and structure, unspecified site: Secondary | ICD-10-CM | POA: Insufficient documentation

## 2015-12-14 DIAGNOSIS — E559 Vitamin D deficiency, unspecified: Secondary | ICD-10-CM

## 2015-12-14 DIAGNOSIS — R140 Abdominal distension (gaseous): Secondary | ICD-10-CM

## 2015-12-14 DIAGNOSIS — R0781 Pleurodynia: Secondary | ICD-10-CM | POA: Diagnosis not present

## 2015-12-14 DIAGNOSIS — M549 Dorsalgia, unspecified: Secondary | ICD-10-CM | POA: Diagnosis not present

## 2015-12-14 LAB — CBC WITH DIFFERENTIAL/PLATELET
BASOS ABS: 0 {cells}/uL (ref 0–200)
Basophils Relative: 0 %
EOS ABS: 195 {cells}/uL (ref 15–500)
Eosinophils Relative: 3 %
HEMATOCRIT: 42.6 % (ref 35.0–45.0)
HEMOGLOBIN: 14.3 g/dL (ref 11.7–15.5)
LYMPHS ABS: 2405 {cells}/uL (ref 850–3900)
LYMPHS PCT: 37 %
MCH: 31.9 pg (ref 27.0–33.0)
MCHC: 33.6 g/dL (ref 32.0–36.0)
MCV: 95.1 fL (ref 80.0–100.0)
MONO ABS: 650 {cells}/uL (ref 200–950)
MPV: 10.4 fL (ref 7.5–12.5)
Monocytes Relative: 10 %
NEUTROS PCT: 50 %
Neutro Abs: 3250 cells/uL (ref 1500–7800)
Platelets: 298 10*3/uL (ref 140–400)
RBC: 4.48 MIL/uL (ref 3.80–5.10)
RDW: 13.2 % (ref 11.0–15.0)
WBC: 6.5 10*3/uL (ref 3.8–10.8)

## 2015-12-14 LAB — LIPID PANEL
CHOL/HDL RATIO: 2.2 ratio (ref <=5.0)
CHOLESTEROL: 185 mg/dL (ref 125–200)
HDL: 86 mg/dL (ref >=46)
LDL Cholesterol: 82 mg/dL (ref <130)
TRIGLYCERIDES: 85 mg/dL (ref <150)
VLDL: 17 mg/dL (ref <30)

## 2015-12-14 LAB — COMPLETE METABOLIC PANEL WITH GFR
ALBUMIN: 4.1 g/dL (ref 3.6–5.1)
ALK PHOS: 56 U/L (ref 33–130)
ALT: 14 U/L (ref 6–29)
AST: 19 U/L (ref 10–35)
BUN: 25 mg/dL (ref 7–25)
CALCIUM: 9.2 mg/dL (ref 8.6–10.4)
CHLORIDE: 103 mmol/L (ref 98–110)
CO2: 23 mmol/L (ref 20–31)
Creat: 0.95 mg/dL — ABNORMAL HIGH (ref 0.60–0.93)
GFR, EST AFRICAN AMERICAN: 69 mL/min (ref >=60)
GFR, EST NON AFRICAN AMERICAN: 60 mL/min (ref >=60)
Glucose, Bld: 99 mg/dL (ref 65–99)
POTASSIUM: 4.4 mmol/L (ref 3.5–5.3)
Sodium: 138 mmol/L (ref 135–146)
Total Bilirubin: 0.5 mg/dL (ref 0.2–1.2)
Total Protein: 6.7 g/dL (ref 6.1–8.1)

## 2015-12-14 LAB — POCT URINALYSIS DIPSTICK
Bilirubin, UA: NEGATIVE
Blood, UA: NEGATIVE
GLUCOSE UA: NEGATIVE
KETONES UA: NEGATIVE
Nitrite, UA: NEGATIVE
PROTEIN UA: NEGATIVE
Spec Grav, UA: <=1.005
Urobilinogen, UA: 0.2
pH, UA: 5

## 2015-12-14 LAB — TSH: TSH: 1.99 mIU/L

## 2015-12-15 LAB — VITAMIN D 25 HYDROXY (VIT D DEFICIENCY, FRACTURES): Vit D, 25-Hydroxy: 48 ng/mL (ref 30–100)

## 2015-12-16 LAB — URINE CULTURE

## 2015-12-19 DIAGNOSIS — I10 Essential (primary) hypertension: Secondary | ICD-10-CM | POA: Diagnosis not present

## 2015-12-19 DIAGNOSIS — M6281 Muscle weakness (generalized): Secondary | ICD-10-CM | POA: Diagnosis not present

## 2015-12-19 DIAGNOSIS — R202 Paresthesia of skin: Secondary | ICD-10-CM | POA: Diagnosis not present

## 2015-12-19 DIAGNOSIS — M25551 Pain in right hip: Secondary | ICD-10-CM | POA: Diagnosis not present

## 2015-12-20 ENCOUNTER — Ambulatory Visit
Admission: RE | Admit: 2015-12-20 | Discharge: 2015-12-20 | Disposition: A | Discharge status: Home / Self Care | Payer: Medicare Other | Source: Ambulatory Visit | Attending: Internal Medicine | Admitting: Internal Medicine

## 2015-12-20 DIAGNOSIS — R0781 Pleurodynia: Secondary | ICD-10-CM

## 2015-12-20 DIAGNOSIS — K580 Irritable bowel syndrome with diarrhea: Secondary | ICD-10-CM

## 2015-12-20 DIAGNOSIS — R1011 Right upper quadrant pain: Secondary | ICD-10-CM | POA: Diagnosis not present

## 2015-12-20 DIAGNOSIS — R140 Abdominal distension (gaseous): Secondary | ICD-10-CM

## 2015-12-20 DIAGNOSIS — R1031 Right lower quadrant pain: Secondary | ICD-10-CM

## 2015-12-20 DIAGNOSIS — G8929 Other chronic pain: Secondary | ICD-10-CM

## 2015-12-20 MED ORDER — IOPAMIDOL (ISOVUE-300) INJECTION 61%
100.0000 mL | Freq: Once | INTRAVENOUS | Status: AC | PRN
  Administered 2015-12-20: 100 mL via INTRAVENOUS

## 2015-12-21 ENCOUNTER — Telehealth: Payer: Self-pay

## 2015-12-21 NOTE — Telephone Encounter (Signed)
Called patient. Gave imaging results and instructions. Patient verbalized understanding.

## 2015-12-21 NOTE — Telephone Encounter (Signed)
-----   Message from Elby Showers, MD sent at 12/20/2015  3:33 PM EDT ----- Please call patient. Has mild diverticulosis which is age related. Also appears to have constipation. Please take 2 Senekot tabs at bedtime one time only and see if can get cleaned out.

## 2015-12-25 ENCOUNTER — Ambulatory Visit (INDEPENDENT_AMBULATORY_CARE_PROVIDER_SITE_OTHER): Payer: Medicare Other | Admitting: Internal Medicine

## 2015-12-25 ENCOUNTER — Encounter: Payer: Self-pay | Admitting: Internal Medicine

## 2015-12-25 VITALS — BP 116/68 | HR 89 | Temp 97.8°F

## 2015-12-25 DIAGNOSIS — I10 Essential (primary) hypertension: Secondary | ICD-10-CM

## 2015-12-25 DIAGNOSIS — Z658 Other specified problems related to psychosocial circumstances: Secondary | ICD-10-CM

## 2015-12-25 DIAGNOSIS — M5431 Sciatica, right side: Secondary | ICD-10-CM | POA: Diagnosis not present

## 2015-12-25 DIAGNOSIS — K5900 Constipation, unspecified: Secondary | ICD-10-CM

## 2015-12-25 DIAGNOSIS — F439 Reaction to severe stress, unspecified: Secondary | ICD-10-CM

## 2015-12-25 DIAGNOSIS — K589 Irritable bowel syndrome without diarrhea: Secondary | ICD-10-CM

## 2015-12-25 NOTE — Patient Instructions (Addendum)
Take Levsin prn diarrhea and may try MiraLAX if having issues with constipation. Complete physical therapy for hip pain.

## 2015-12-26 DIAGNOSIS — M6281 Muscle weakness (generalized): Secondary | ICD-10-CM | POA: Diagnosis not present

## 2015-12-26 DIAGNOSIS — M25551 Pain in right hip: Secondary | ICD-10-CM | POA: Diagnosis not present

## 2015-12-26 DIAGNOSIS — I10 Essential (primary) hypertension: Secondary | ICD-10-CM | POA: Diagnosis not present

## 2015-12-26 DIAGNOSIS — R202 Paresthesia of skin: Secondary | ICD-10-CM | POA: Diagnosis not present

## 2015-12-28 NOTE — Progress Notes (Signed)
   Subjective:    Patient ID: Amber Garrett, female    DOB: 1940/10/16, 75 y.o.   MRN: [id number]  HPI She is here today to follow-up on abdominal pain and bloating and diarrhea as well as back/hip pain. Currently in physical therapy for back/hip pain issues. Does have history of sciatica. Seems to have issues with right hip pain with sitting and driving.  She is planning to go to [place] in September. Husband is at [facility] with dementia. They are having to make some decisions about future care for him. This is somewhat stressful.   She has a long-standing history of Irritable bowel issues. At last visit I gave her some Levsin which has helped some. Some mornings she has up to 3 bowel movements daily and then can have more diarrhea later in the day.  Her  friend died of pancreatic cancer. Patient was worried about this, I think. Recent CT showed that she was constipated but she said that the constipation ensued after having a day or so of diarrhea.  She does take a magnesium supplement to help with constipation.    Review of Systems see above     Objective:   Physical Exam  Not examined. Spent 25 minutes speaking with her about these issues. She does have situational stress with husband and I think clearly irritable bowel syndrome. Back pain recently as well and has been referred to physical therapy situational stress        Assessment & Plan:  Irritable bowel syndrome  Situational stress   Right back pain-possible sciatica-currently in physical therapy  History of hypertension-stable  Plan: Take Levsin when necessary. Finish physical therapy sessions. May try MiraLAX of having issues with constipation

## 2015-12-29 DIAGNOSIS — K589 Irritable bowel syndrome without diarrhea: Secondary | ICD-10-CM | POA: Insufficient documentation

## 2015-12-29 NOTE — Progress Notes (Signed)
   Subjective:    Patient ID: Amber Garrett, female    DOB: 20-Apr-1941, 75 y.o.   MRN: [id number]  HPI Patient in today complaining of issues with right lower back pain as well as abdominal discomfort. Abdominal discomfort has been going on since June. She takes magnesium tablets for constipation. Sometimes has 2:58 AM bowel movements and sometimes another one later in the morning. Long-standing history of what sounds like irritable bowel syndrome at times. She's not sure what triggers it. Her husband is at [facility] with dementia. Has some situational stress. If she has diarrhea she will not take magnesium tablet on the same day. Subsequently can have some intermittent constipation. She's had no vomiting. Not sure that any certain foods trigger the diarrhea. Doesn't seem to be related to lactose intolerance.  Has had right sciatica in the past. Back pain is below her right rib cage area. Also has right hip and buttock pain particularly with driving and sitting.    Review of Systems see above     Objective:   Physical Exam  Abdominal exam no hepatosplenomegaly masses or significant tenderness. Very slight abdominal distention. No stool to guaiac.  Straight leg raising is negative at 90 bilaterally. Deep tendon reflexes 2+ and symmetrical with normal muscle strength. No pain with internal or external rotation of right hip. Has point tenderness right lower rib cage area posteriorly      Assessment & Plan:  Probable irritable bowel syndrome. Issue is that her friend died of pancreatic cancer and I think she is concerned about that. We'll go to go ahead and do a CT of abdomen and pelvis because of abdominal discomfort. We'll see her back in follow-up and will give her some Levsin to try.  Also has right rib cage pain consistent with musculoskeletal chest wall pain. To have rib x-ray.  She can undergo physical therapy for right back pain.  Time spent with patient 40 minutes,To have lab  work as well.

## 2015-12-31 ENCOUNTER — Other Ambulatory Visit: Payer: Self-pay

## 2016-01-09 ENCOUNTER — Other Ambulatory Visit: Payer: Self-pay | Admitting: Internal Medicine

## 2016-01-21 ENCOUNTER — Telehealth: Payer: Self-pay | Admitting: Internal Medicine

## 2016-01-21 NOTE — Telephone Encounter (Signed)
Patient just returned yesterday from a trip to [place].  She got sick Thursday while in [place] with a cold.  Denies fever, chills.  States she just has a terrible cough/congestion.  States that she coughed the entire plane ride back across the [place].  Wants to know if you will call in Hydromet cough syrup for her.  Advised that I would ask; however, she may need to come in to be seen.    Please advise.    Best # for contact:  [phone]  Pharmacy:  [facility] @ [phone]

## 2016-01-21 NOTE — Telephone Encounter (Signed)
Patient called back; provided appointment for 9/19 @ 11:45 a.m.

## 2016-01-21 NOTE — Telephone Encounter (Signed)
Left message for patient that she would need to be seen in the office.  Patient instructed to call us back and I'll make an appointment for her.

## 2016-01-21 NOTE — Telephone Encounter (Signed)
Hydrocodone cannot be called in. It is a controlled substance. Needs OV

## 2016-01-22 ENCOUNTER — Ambulatory Visit: Payer: Medicare Other | Admitting: Internal Medicine

## 2016-02-13 ENCOUNTER — Other Ambulatory Visit: Payer: Self-pay | Admitting: *Deleted

## 2016-02-13 MED ORDER — ZOLPIDEM TARTRATE 10 MG PO TABS: ORAL_TABLET | ORAL | 4 refills | Status: DC

## 2016-02-25 ENCOUNTER — Ambulatory Visit (INDEPENDENT_AMBULATORY_CARE_PROVIDER_SITE_OTHER): Payer: Medicare Other | Admitting: Internal Medicine

## 2016-02-25 DIAGNOSIS — Z23 Encounter for immunization: Secondary | ICD-10-CM | POA: Diagnosis not present

## 2016-02-26 DIAGNOSIS — H52223 Regular astigmatism, bilateral: Secondary | ICD-10-CM | POA: Diagnosis not present

## 2016-02-26 DIAGNOSIS — H5213 Myopia, bilateral: Secondary | ICD-10-CM | POA: Diagnosis not present

## 2016-02-26 DIAGNOSIS — H26493 Other secondary cataract, bilateral: Secondary | ICD-10-CM | POA: Diagnosis not present

## 2016-02-26 DIAGNOSIS — H43813 Vitreous degeneration, bilateral: Secondary | ICD-10-CM | POA: Diagnosis not present

## 2016-02-26 DIAGNOSIS — H524 Presbyopia: Secondary | ICD-10-CM | POA: Diagnosis not present

## 2016-04-04 DIAGNOSIS — K5730 Diverticulosis of large intestine without perforation or abscess without bleeding: Secondary | ICD-10-CM | POA: Diagnosis not present

## 2016-04-04 DIAGNOSIS — Z1211 Encounter for screening for malignant neoplasm of colon: Secondary | ICD-10-CM | POA: Diagnosis not present

## 2016-04-17 ENCOUNTER — Other Ambulatory Visit: Payer: Self-pay | Admitting: Internal Medicine

## 2016-04-17 NOTE — Telephone Encounter (Signed)
Last CPE was Summer of 2016. Please book in [place] (Feb) and refill until then

## 2016-05-14 ENCOUNTER — Encounter: Payer: Self-pay | Admitting: Internal Medicine

## 2016-05-19 ENCOUNTER — Ambulatory Visit (INDEPENDENT_AMBULATORY_CARE_PROVIDER_SITE_OTHER): Payer: Medicare Other | Admitting: Sports Medicine

## 2016-05-19 ENCOUNTER — Encounter: Payer: Self-pay | Admitting: Sports Medicine

## 2016-05-19 DIAGNOSIS — M5431 Sciatica, right side: Secondary | ICD-10-CM | POA: Diagnosis not present

## 2016-05-19 NOTE — Progress Notes (Signed)
  Subjective:    Patient ID: Amber Garrett, female    DOB: [birth year], 76 y.o.   MRN: [id number]   CC: [place], right buttock pain  HPI: 76 y/o F presenting for right buttock pain  Right buttuck pain - initially noted right sided sciatic pain for several years which improved with NSAIDs and physical therapy - now just notes continued right buttock pain, no radiation of her pain but she has noted right LE tingling, no numbness for the last several months - continues to take NSAID's occasionally for buttock pain - pain worse with prolonged sitting and driving  - she also noted right LE weakness that she noticed with her personal trainer several weeks ago - denies issues with ambulation of pain with ambulation  Pertinent Past medical history- arthritis  Review of Systems  Per HPI   Objective:  BP 138/88   Ht 5\' 5"  (1.651 m)   Wt 151 lb (68.5 kg)   BMI 25.13 kg/m  Vitals and nursing note reviewed  General: NAD MSK:  Bilateral feet warm and well perfused No right LE edema or swelling, warm to palpation with + DP pulses 5/5 strength with left hip flexion, 4/5 right hip flexion, 5/5 bilateral knee flexion and extension, foot dorsiflexion and plantar flexion 4/5 right hip abduction, 5/5 left hip abduction 2+ patellar and achilles reflexes bilaterally Gluteal pain reproduced with hip forward flexion   Assessment & Plan:    Sciatica of right side History and physical exam consistent with right sided sciatica, with symptoms worsened by spine flexion. Xray lumbar spine in 12/2015 reviewed which confirmed lumbar arthritis and degenerative changes - will give exercises to improve spine extension - offered cortisone shot, but patient declined at this time, she will return if she reconsiders - she is going to [place] in April, so she will consider injection prior to this    Haniel Fix A. Lincoln Brigham MD, [facility] Resident PGY-3 Pager [phone]  Patient seen and evaluated  with the resident. I agree with the above plan of care. Review of this patient's x-rays from 2017 shows some mild multilevel degenerative changes throughout the midportion of the lumbar spine. Nothing acute. I've given her 3 McKenzie-type exercises to start doing daily. She can incorporate these into her workouts with her personal trainer. She will avoid any sort of exercise or activity that involves repetitive flexion at the hip and she will follow-up with me in a month. We discussed the possibility of an IM Depo-Medrol injection but she would like to hold on that for now. She will call with questions or concerns prior to her follow-up visit.

## 2016-05-19 NOTE — Assessment & Plan Note (Signed)
History and physical exam consistent with right sided sciatica, with symptoms worsened by spine flexion. Xray lumbar spine in 12/2015 reviewed which confirmed lumbar arthritis and degenerative changes - will give exercises to improve spine extension - offered cortisone shot, but patient declined at this time, she will return if she reconsiders - she is going to [place] in April, so she will consider injection prior to this

## 2016-06-10 ENCOUNTER — Other Ambulatory Visit: Payer: Medicare Other | Admitting: Internal Medicine

## 2016-06-10 DIAGNOSIS — Z0000 Encounter for general adult medical examination without abnormal findings: Secondary | ICD-10-CM | POA: Diagnosis not present

## 2016-06-10 DIAGNOSIS — E559 Vitamin D deficiency, unspecified: Secondary | ICD-10-CM

## 2016-06-10 DIAGNOSIS — I10 Essential (primary) hypertension: Secondary | ICD-10-CM

## 2016-06-10 DIAGNOSIS — M8580 Other specified disorders of bone density and structure, unspecified site: Secondary | ICD-10-CM

## 2016-06-10 LAB — COMPREHENSIVE METABOLIC PANEL
ALBUMIN: 4.3 g/dL (ref 3.6–5.1)
ALK PHOS: 54 U/L (ref 33–130)
ALT: 11 U/L (ref 6–29)
AST: 18 U/L (ref 10–35)
BUN: 25 mg/dL (ref 7–25)
CHLORIDE: 104 mmol/L (ref 98–110)
CO2: 25 mmol/L (ref 20–31)
CREATININE: 1 mg/dL — AB (ref 0.60–0.93)
Calcium: 9.6 mg/dL (ref 8.6–10.4)
Glucose, Bld: 100 mg/dL — ABNORMAL HIGH (ref 65–99)
POTASSIUM: 4.5 mmol/L (ref 3.5–5.3)
Sodium: 140 mmol/L (ref 135–146)
TOTAL PROTEIN: 6.9 g/dL (ref 6.1–8.1)
Total Bilirubin: 0.6 mg/dL (ref 0.2–1.2)

## 2016-06-10 LAB — LIPID PANEL
CHOLESTEROL: 175 mg/dL (ref <200)
HDL: 79 mg/dL (ref >50)
LDL Cholesterol: 79 mg/dL (ref <100)
TRIGLYCERIDES: 85 mg/dL (ref <150)
Total CHOL/HDL Ratio: 2.2 Ratio (ref <5.0)
VLDL: 17 mg/dL (ref <30)

## 2016-06-10 LAB — CBC WITH DIFFERENTIAL/PLATELET
BASOS PCT: 0 %
Basophils Absolute: 0 cells/uL (ref 0–200)
EOS PCT: 2 %
Eosinophils Absolute: 130 cells/uL (ref 15–500)
HCT: 42.9 % (ref 35.0–45.0)
Hemoglobin: 14.4 g/dL (ref 11.7–15.5)
LYMPHS ABS: 2665 {cells}/uL (ref 850–3900)
LYMPHS PCT: 41 %
MCH: 31.6 pg (ref 27.0–33.0)
MCHC: 33.6 g/dL (ref 32.0–36.0)
MCV: 94.3 fL (ref 80.0–100.0)
MONO ABS: 520 {cells}/uL (ref 200–950)
MPV: 10.5 fL (ref 7.5–12.5)
Monocytes Relative: 8 %
NEUTROS PCT: 49 %
Neutro Abs: 3185 cells/uL (ref 1500–7800)
Platelets: 287 10*3/uL (ref 140–400)
RBC: 4.55 MIL/uL (ref 3.80–5.10)
RDW: 13.4 % (ref 11.0–15.0)
WBC: 6.5 10*3/uL (ref 3.8–10.8)

## 2016-06-10 LAB — TSH: TSH: 1.79 mIU/L

## 2016-06-11 LAB — VITAMIN D 25 HYDROXY (VIT D DEFICIENCY, FRACTURES): Vit D, 25-Hydroxy: 58 ng/mL (ref 30–100)

## 2016-06-13 ENCOUNTER — Ambulatory Visit (INDEPENDENT_AMBULATORY_CARE_PROVIDER_SITE_OTHER): Payer: Medicare Other | Admitting: Internal Medicine

## 2016-06-13 ENCOUNTER — Encounter: Payer: Self-pay | Admitting: Internal Medicine

## 2016-06-13 VITALS — BP 120/80 | HR 86 | Ht 63.75 in | Wt 152.0 lb

## 2016-06-13 DIAGNOSIS — I10 Essential (primary) hypertension: Secondary | ICD-10-CM | POA: Diagnosis not present

## 2016-06-13 DIAGNOSIS — F411 Generalized anxiety disorder: Secondary | ICD-10-CM

## 2016-06-13 DIAGNOSIS — K589 Irritable bowel syndrome without diarrhea: Secondary | ICD-10-CM

## 2016-06-13 DIAGNOSIS — G4700 Insomnia, unspecified: Secondary | ICD-10-CM

## 2016-06-13 DIAGNOSIS — Z0000 Encounter for general adult medical examination without abnormal findings: Secondary | ICD-10-CM | POA: Diagnosis not present

## 2016-06-13 DIAGNOSIS — R8290 Unspecified abnormal findings in urine: Secondary | ICD-10-CM | POA: Diagnosis not present

## 2016-06-13 DIAGNOSIS — Z8739 Personal history of other diseases of the musculoskeletal system and connective tissue: Secondary | ICD-10-CM

## 2016-06-13 DIAGNOSIS — I272 Pulmonary hypertension, unspecified: Secondary | ICD-10-CM | POA: Diagnosis not present

## 2016-06-13 DIAGNOSIS — I071 Rheumatic tricuspid insufficiency: Secondary | ICD-10-CM

## 2016-06-13 LAB — POCT URINALYSIS DIPSTICK
Bilirubin, UA: NEGATIVE
GLUCOSE UA: NEGATIVE
Ketones, UA: NEGATIVE
NITRITE UA: NEGATIVE
PH UA: 7
PROTEIN UA: NEGATIVE
RBC UA: NEGATIVE
SPEC GRAV UA: 1.015
UROBILINOGEN UA: NEGATIVE

## 2016-06-13 MED ORDER — ZOLPIDEM TARTRATE 10 MG PO TABS: ORAL_TABLET | ORAL | 5 refills | Status: DC

## 2016-06-13 NOTE — Progress Notes (Signed)
Subjective:    Patient ID: Amber Garrett, female    DOB: 1941/01/23, 76 y.o.   MRN: [id number]  HPI 76 year old Female in today for health maintenance exam and evaluation of medical issues. She feels well and has no new complaints. History of hypertension.  GI complaints she had her previously have resolved as long as she is taking when necessary Levsin. She takes Ambien to sleep. Long-standing history of insomnia. Takes Lisinopril 5 mg daily for hypertension.  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 History of sciatica and osteopenia.  Past medical history: Possible allergy to Macrodantin because it caused rash on arms only. History of C-section and incisional hernia with appendectomy 1973. C-section in [place] in 1970. Endn, resection of submucous myoma 1998. Colonoscopy done in 2007 showing only diverticulosis. Repeat study recommended in 10 years.  Patient had 2-D echocardiogram in 2006 to evaluate cardiac murmur. She had moderate tricuspid regurgitation with elevation of right ventricular systolic pressure at [id number] mm consistent with mild pulmonary hypertension. Mitral valve leaflets were thickened but opened well with no stenosis. Trace mitral regurgitation.  Bone density study January 2012 showed osteopenia.  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 Social alcohol consumption. She exercises regularly and enjoys traveling. Her husband is a retired Forensic psychologist who has multiple sclerosis and dementia. He now lives at [place] and has gone downhill over the past year. She does see him on a daily basis.  Family history: Father died at age 65 with pulmonary fibrosis. Mother died with history of hypertension, heart issues, bradycardia, C. Difficile and MRSA. One brother in good health. A son and daughter in good health.  Had cataract extractions by Dr. Bing Plume in August 2014 and in October 2014.  Last  Pap smear 2012.        Review of Systems  Constitutional: Negative.   All other systems reviewed and are negative.      Objective:   Physical Exam  Constitutional: She is oriented to person, place, and time. She appears well-developed and well-nourished. No distress.  HENT:  Head: Normocephalic and atraumatic.  Right Ear: External ear normal.  Left Ear: External ear normal.  Mouth/Throat: Oropharynx is clear and moist. No oropharyngeal exudate.  Eyes: Conjunctivae and EOM are normal. Pupils are equal, round, and reactive to light. Right eye exhibits no discharge. Left eye exhibits no discharge. No scleral icterus.  Neck: Neck supple. No JVD present. No thyromegaly present.  Cardiovascular: Normal rate, regular rhythm and intact distal pulses.   Murmur heard. 1/6 systolic ejection murmur  Pulmonary/Chest: Effort normal and breath sounds normal. No respiratory distress. She has no wheezes. She has no rales. She exhibits no tenderness.  Abdominal: Soft. Bowel sounds are normal. She exhibits no distension. There is no tenderness. There is no rebound and no guarding.  Genitourinary:  Genitourinary Comments: Bimanual normal.Pap deferred due to age  Musculoskeletal: She exhibits no edema.  Lymphadenopathy:    She has no cervical adenopathy.  Neurological: She is alert and oriented to person, place, and time. She has normal reflexes. Coordination normal.  Skin: Skin is warm and dry. No rash noted. She is not diaphoretic.  Psychiatric: She has a normal mood and affect. Her behavior is normal. Judgment and thought content normal.  Vitals reviewed.         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  Subjective:   Patient presents for Medicare Annual/Subsequent preventive examination.  Review Past Medical/Family/Social:see above   Risk Factors  Current exercise habits: exercises regularly Dietary issues discussed: low-fat low carbohydrate  Cardiac risk factors:hypertension, family history in mother  Depression Screen  (Note: if answer to either of the following is "Yes", a more complete depression screening is indicated)   Over the past two weeks, have you felt down, depressed or hopeless? No  Over the past two weeks, have you felt little interest or pleasure in doing things? No Have you lost interest or pleasure in daily life? No Do you often feel hopeless? No Do you cry easily over simple problems? No   Activities of Daily Living  In your present state of health, do you have any difficulty performing the following activities?:   Driving? No  Managing money? No  Feeding yourself? No  Getting from bed to chair? No  Climbing a flight of stairs? No  Preparing food and eating?: No  Bathing or showering? No  Getting dressed: No  Getting to the toilet? No  Using the toilet:No  Moving around from place to place: No  In the past year have you fallen or had a near fall?:yes Are you sexually active? No  Do you have more than one partner? No   Hearing Difficulties: No  Do you often ask people to speak up or repeat themselves? Yes history of hearing loss Do you experience ringing or noises in your ears? No  Do you have difficulty understanding soft or whispered voices? Yes history of hearing loss Do you feel that you have a problem with memory? No Do you often misplace items? No    Home Safety:  Do you have a smoke alarm at your residence? Yes Do you have grab bars in the bathroom?yes Do you have throw rugs in your  house?no   Cognitive Testing  Alert? Yes Normal Appearance?Yes  Oriented to person? Yes Place? Yes  Time? Yes  Recall of three objects? Yes  Can perform simple calculations? Yes  Displays appropriate judgment?Yes  Can read the correct time from a watch face?Yes   List the Names of Other Physician/Practitioners you currently use:  See referral list for the physicians patient is currently seeing.     Review of Systems:see above   Objective:     General appearance: Appears stated age  Head: Normocephalic, without obvious abnormality, atraumatic  Eyes: conj clear, EOMi PEERLA  Ears: normal TM's and external ear canals both ears  Nose: Nares normal. Septum midline. Mucosa normal. No drainage or sinus tenderness.  Throat: lips, mucosa, and tongue normal; teeth and gums normal  Neck: no adenopathy, no carotid bruit, no JVD, supple, symmetrical, trachea midline and thyroid not enlarged, symmetric, no tenderness/mass/nodules  No CVA tenderness.  Lungs: clear to auscultation bilaterally  Breasts: normal appearance, no masses or tenderness Heart: regular rate and rhythm, S1, S2 normal, no murmur, click, rub or gallop  Abdomen: soft, non-tender; bowel sounds normal; no masses, no organomegaly  Musculoskeletal: ROM normal in all joints, no crepitus, no deformity, Normal muscle strengthen. Back  is symmetric, no curvature. Skin: Skin color, texture, turgor normal. No rashes or  lesions  Lymph nodes: Cervical, supraclavicular, and axillary nodes normal.  Neurologic: CN 2 -12 Normal, Normal symmetric reflexes. Normal coordination and gait  Psych: Alert & Oriented x 3, Mood appear stable.    Assessment:    Annual wellness medicare exam   Plan:    During the course of the visit the patient was educated and counseled about appropriate screening and preventive services including:   Annual mammogram  Recommend annual flu vaccine     Patient Instructions (the written plan) was given  to the patient.  Medicare Attestation  I have personally reviewed:  The patient's medical and social history  Their use of alcohol, tobacco or illicit drugs  Their current medications and supplements  The patient's functional ability including ADLs,fall risks, home safety risks, cognitive, and hearing and visual impairment  Diet and physical activities  Evidence for depression or mood disorders  The patient's weight, height, BMI, and visual acuity have been recorded in the chart. I have made referrals, counseling, and provided education to the patient based on review of the above and I have provided the patient with a written personalized care plan for preventive services.

## 2016-06-16 ENCOUNTER — Emergency Department (HOSPITAL_COMMUNITY)
Admission: EM | Admit: 2016-06-16 | Discharge: 2016-06-16 | Disposition: A | Discharge status: Home / Self Care | Payer: Medicare Other | Attending: Emergency Medicine | Admitting: Emergency Medicine

## 2016-06-16 ENCOUNTER — Encounter (HOSPITAL_COMMUNITY): Payer: Self-pay | Admitting: Emergency Medicine

## 2016-06-16 ENCOUNTER — Encounter: Payer: Self-pay | Admitting: Internal Medicine

## 2016-06-16 ENCOUNTER — Telehealth: Payer: Self-pay | Admitting: Internal Medicine

## 2016-06-16 ENCOUNTER — Ambulatory Visit: Payer: Medicare Other | Admitting: Sports Medicine

## 2016-06-16 DIAGNOSIS — I10 Essential (primary) hypertension: Secondary | ICD-10-CM | POA: Insufficient documentation

## 2016-06-16 DIAGNOSIS — R42 Dizziness and giddiness: Secondary | ICD-10-CM | POA: Diagnosis not present

## 2016-06-16 LAB — URINALYSIS, ROUTINE W REFLEX MICROSCOPIC
Bacteria, UA: NONE SEEN
Bilirubin Urine: NEGATIVE
Glucose, UA: NEGATIVE mg/dL
Hgb urine dipstick: NEGATIVE
Ketones, ur: NEGATIVE mg/dL
Nitrite: NEGATIVE
PROTEIN: NEGATIVE mg/dL
Specific Gravity, Urine: 1.005 (ref 1.005–1.030)
pH: 7 (ref 5.0–8.0)

## 2016-06-16 LAB — I-STAT TROPONIN, ED: Troponin i, poc: 0 ng/mL (ref 0.00–0.08)

## 2016-06-16 LAB — BASIC METABOLIC PANEL
ANION GAP: 7 (ref 5–15)
BUN: 21 mg/dL — ABNORMAL HIGH (ref 6–20)
CALCIUM: 10 mg/dL (ref 8.9–10.3)
CO2: 27 mmol/L (ref 22–32)
Chloride: 102 mmol/L (ref 101–111)
Creatinine, Ser: 1.03 mg/dL — ABNORMAL HIGH (ref 0.44–1.00)
GFR calc non Af Amer: 53 mL/min — ABNORMAL LOW (ref >60)
GLUCOSE: 111 mg/dL — AB (ref 65–99)
POTASSIUM: 3.9 mmol/L (ref 3.5–5.1)
Sodium: 136 mmol/L (ref 135–145)

## 2016-06-16 LAB — CBC
HEMATOCRIT: 41.5 % (ref 36.0–46.0)
HEMOGLOBIN: 14.4 g/dL (ref 12.0–15.0)
MCH: 31.4 pg (ref 26.0–34.0)
MCHC: 34.7 g/dL (ref 30.0–36.0)
MCV: 90.4 fL (ref 78.0–100.0)
Platelets: 277 10*3/uL (ref 150–400)
RBC: 4.59 MIL/uL (ref 3.87–5.11)
RDW: 13 % (ref 11.5–15.5)
WBC: 6.8 10*3/uL (ref 4.0–10.5)

## 2016-06-16 NOTE — Patient Instructions (Signed)
It was a pleasure to see you today. Please continue same medications and return in one year or as needed.

## 2016-06-16 NOTE — Telephone Encounter (Signed)
Please ask that she be taken to ED for evaluation
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
## 2016-06-16 NOTE — Discharge Instructions (Signed)
Call Dr. Renold Genta tomorrow for follow up . She may want to see within the office. Return if your condition worsens for any reason
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
## 2016-06-16 NOTE — Telephone Encounter (Signed)
Spoke with patient @ [phone]; advised of Dr. Verlene Mayer instructions.  Patient will call one of her friend's back and let them know that she needs one of them to drive her to the ED.  Patient verbalized understanding of the instructions given.
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
## 2016-06-16 NOTE — ED Notes (Signed)
Patient attempting urine sample.

## 2016-06-18 LAB — URINE CULTURE

## 2016-06-19 ENCOUNTER — Encounter: Payer: Self-pay | Admitting: Internal Medicine

## 2016-06-19 ENCOUNTER — Ambulatory Visit (INDEPENDENT_AMBULATORY_CARE_PROVIDER_SITE_OTHER): Payer: Medicare Other | Admitting: Internal Medicine

## 2016-06-19 VITALS — BP 112/88 | HR 72 | Temp 98.8°F | Wt 150.0 lb

## 2016-06-19 DIAGNOSIS — R42 Dizziness and giddiness: Secondary | ICD-10-CM

## 2016-06-19 DIAGNOSIS — Z87898 Personal history of other specified conditions: Secondary | ICD-10-CM | POA: Diagnosis not present

## 2016-06-19 DIAGNOSIS — I10 Essential (primary) hypertension: Secondary | ICD-10-CM

## 2016-06-19 DIAGNOSIS — N3000 Acute cystitis without hematuria: Secondary | ICD-10-CM

## 2016-06-19 MED ORDER — ESZOPICLONE 2 MG PO TABS: 2.0000 mg | ORAL_TABLET | Freq: Every evening | ORAL | 0 refills | Status: DC | PRN

## 2016-06-19 MED ORDER — AMOXICILLIN-POT CLAVULANATE 500-125 MG PO TABS: 1.0000 | ORAL_TABLET | Freq: Three times a day (TID) | ORAL | 0 refills | Status: DC
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
## 2016-06-19 NOTE — Patient Instructions (Addendum)
To have MRI of the brain with contrast. Also to have carotid Doppler studies, 2-D echocardiogram and 24-hour Holter monitor. Consider neurology consultation. May discontinue meclizine if not dizzy. Trial of Lunesta instead of Ambien.

## 2016-06-23 ENCOUNTER — Encounter: Payer: Self-pay | Admitting: Sports Medicine

## 2016-06-23 ENCOUNTER — Ambulatory Visit (INDEPENDENT_AMBULATORY_CARE_PROVIDER_SITE_OTHER): Payer: Medicare Other | Admitting: Sports Medicine

## 2016-06-23 VITALS — BP 127/78 | HR 74 | Ht 64.0 in | Wt 148.0 lb

## 2016-06-23 DIAGNOSIS — M5431 Sciatica, right side: Secondary | ICD-10-CM | POA: Diagnosis not present

## 2016-06-23 DIAGNOSIS — M5136 Other intervertebral disc degeneration, lumbar region: Secondary | ICD-10-CM

## 2016-06-23 MED ORDER — METHYLPREDNISOLONE ACETATE 80 MG/ML IJ SUSP
80.0000 mg | Freq: Once | INTRAMUSCULAR | Status: AC
  Administered 2016-06-23: 80 mg via INTRAMUSCULAR

## 2016-06-23 NOTE — Assessment & Plan Note (Signed)
A: Symptoms appear consistent with discogenic pain, possibly L5 level based on some noted weakness with great toe extension on the right and exacerbation of symptoms with spinal flexion.  P: - will provide steroid injection to right gluteal region today with 80 IM of Depomedrol - MRI L-spine to rule out disc herniation - Physical therapy - will call with MRI results

## 2016-06-23 NOTE — Progress Notes (Signed)
   Subjective:    Patient ID: Amber Garrett, female    DOB: 1941/01/27, 76 y.o.   MRN: [id number]  CC: f/u for right buttock pain  HPI Returns for f/u of right buttock pain initially evaluated on 05/19/2016.  Given McKenzie exercises and further conservative care.  Initially, symptoms aggravated by exercises so she took a week off and used NSAIDs.  She has slowly reinstated exercises but has not experienced any relief.  She continues to work with Physiological scientist, stretches and foam rolls regularly.  She complains of right foot tingling up to her mid calf and occasional weakness with certain exercises while working with her Physiological scientist.  She feels pain is exacerbated by sitting for extended periods of time and obtains relief when standing.  Pain is not really reproducible on exam.  In the interval, she has been evaluated by EDP and PCP for dizziness and is scheduled for MRI of her brain.   Review of Systems Please see pertinent ROS reviewed in HPI and problem based charting.     Objective:   Physical Exam  Constitutional: She is oriented to person, place, and time. She appears well-developed and well-nourished.  Musculoskeletal: She exhibits no edema, tenderness or deformity.  RIGHT: 5/5 plantarflexion and dorsiflexion, knee flexion and extension. 4/5 great toe extension, hip flexion 2+ patellar reflex 1+ achilles reflex LEFT:  5/5 strength throughout 2+ patellar reflex 1+ achilles reflex   Neurological: She is alert and oriented to person, place, and time.  Psychiatric: She has a normal mood and affect. Judgment normal.      Assessment & Plan:   Sciatica of right side A: Symptoms appear consistent with discogenic pain, possibly L5 level based on some noted weakness with great toe extension on the right and exacerbation of symptoms with spinal flexion.  P: - will provide steroid injection to right gluteal region today with 80 IM of Depomedrol - MRI L-spine to rule out  disc herniation - Physical therapy - will call with MRI results   Jule Ser, DO 06/23/2016, 2:22 PM PGY-2, [facility]  Patient seen and evaluated with the resident. I agree with the above plan of care. We will proceed with an MRI specifically to rule out lumbar disc herniation. In the meantime, we will refer her to physical therapy and inject her with 80 mg of Depo-Medrol IM. Phone follow-up after the MRI to discuss the results and delineate further treatment. If disc herniation is confirmed on MRI patient continues to have pain then she may benefit from a lumbar epidural steroid injection.

## 2016-06-23 NOTE — Progress Notes (Signed)
   Subjective:    Patient ID: Amber Garrett, female    DOB: [birth year], 76 y.o.   MRN: [id number]  HPI    Review of Systems     Objective:   Physical Exam        Assessment & Plan:

## 2016-06-25 ENCOUNTER — Ambulatory Visit: Payer: Medicare Other | Attending: Sports Medicine | Admitting: Physical Therapy

## 2016-06-25 DIAGNOSIS — R293 Abnormal posture: Secondary | ICD-10-CM | POA: Diagnosis not present

## 2016-06-25 DIAGNOSIS — M545 Low back pain: Secondary | ICD-10-CM | POA: Diagnosis not present

## 2016-06-25 DIAGNOSIS — M25551 Pain in right hip: Secondary | ICD-10-CM | POA: Diagnosis not present

## 2016-06-25 DIAGNOSIS — M62838 Other muscle spasm: Secondary | ICD-10-CM | POA: Diagnosis not present

## 2016-06-25 DIAGNOSIS — G8929 Other chronic pain: Secondary | ICD-10-CM | POA: Diagnosis not present

## 2016-06-25 NOTE — Therapy (Signed)
[place], [place], [address] Phone: [phone]   Fax:  [phone]  Physical Therapy Evaluation  Patient Details  Name: Amber Garrett MRN: [id number] Date of Birth: [birth year] Referring Provider: Lilia Argue MD  Encounter Date: 06/25/2016      PT End of Session - 06/25/16 1555    Visit Number 1   Number of Visits 13   Date for PT Re-Evaluation 08/20/16   Authorization Type Medicare: Kx mod by 15th visit, progress note by 10th visit   PT Start Time 1506   PT Stop Time 1548   PT Time Calculation (min) 42 min   Activity Tolerance Patient tolerated treatment well   Behavior During Therapy [facility] for tasks assessed/performed      Past Medical History:  Diagnosis Date  . HTN (hypertension)   . Hx: UTI (urinary tract infection)   . OA (osteoarthritis)   . Positive PPD   . Sciatica   . Vaginal atrophy     Past Surgical History:  Procedure Laterality Date  . APPENDECTOMY    . CESAREAN SECTION    . ENDOMETRIAL ABLATION    . INGUINAL HERNIA REPAIR      There were no vitals filed for this visit.       Subjective Assessment - 06/25/16 1513    Subjective pt is a 75. y.o F with CC of low back / hip with referral to the toes in the RLE, that has been going on for over 20 years with hx of sciatica. reports most of the pain being a pain inthe lower glute and LE with some tingling.  Since onset it flucuates. pt reports cortison injection on Monday but it didn't help much.    Limitations Sitting;Lifting;House hold activities  gardening/ vacuuming   How long can you sit comfortably? 45 min   How long can you stand comfortably? unlimited   How long can you walk comfortably? unlimited   Diagnostic tests MRI on Sunday    Patient Stated Goals to calm the pain, get some strength back. stay independent.    Currently in Pain? Yes   Pain Score 0-No pain  5/10 at its worst   Pain Location Back   Pain  Orientation Right   Pain Descriptors / Indicators Throbbing;Tightness   Pain Radiating Towards  Goes to the R foot   Pain Onset More than a month ago   Pain Frequency Constant   Aggravating Factors  sitting   Pain Relieving Factors standing, heat/ ice            [facility] PT Assessment - 06/25/16 1507      Assessment   Medical Diagnosis Degenerative disc disease   Referring Provider timothy Draper MD   Onset Date/Surgical Date --  going on for over 76 years   Hand Dominance Right   Next MD Visit Make one after the imaging   Prior Therapy yes     Precautions   Precautions None     Restrictions   Weight Bearing Restrictions No     Balance Screen   Has the patient fallen in the past 6 months Yes   How many times? 1   Has the patient had a decrease in activity level because of a fear of falling?  No   Is the patient reluctant to leave their home because of a fear of falling?  No     Home Ecologist residence  Living Arrangements Alone   Available Help at Discharge Available PRN/intermittently   Type of [place] to enter   Entrance Stairs-Number of Steps 2   Entrance Stairs-Rails Can reach both   Home Layout Two level   Alternate Level Stairs-Number of Steps 13   Alternate Level Stairs-Rails Right  2 railings half-way up      Prior Function   Level of Independence Independent;Independent with basic ADLs   Vocation Retired   Leisure traveling, spending time with friends/ family, go to church, musical events     Cognition   Overall Cognitive Status Within Functional Limits for tasks assessed     Observation/Other Assessments   Focus on Therapeutic Outcomes (FOTO)  37% limited     Posture/Postural Control   Posture/Postural Control Postural limitations   Postural Limitations Rounded Shoulders;Forward head;Decreased lumbar lordosis  scoliosis     ROM / Strength   AROM / PROM / Strength AROM;Strength     AROM    AROM Assessment Site Lumbar   Lumbar Flexion 102   Lumbar Extension 22   Lumbar - Right Side Bend 12   Lumbar - Left Side Bend 18     Strength   Strength Assessment Site Hip;Knee   Right/Left Hip Right;Left   Right Hip Flexion 3+/5   Right Hip Extension 3+/5   Right Hip ABduction 3+/5   Right Hip ADduction 5/5   Left Hip Flexion 4-/5   Left Hip Extension 4/5   Left Hip ABduction 4/5   Left Hip ADduction 5/5   Right/Left Knee Right;Left   Right Knee Flexion 4/5   Right Knee Extension 4+/5   Left Knee Flexion 5/5   Left Knee Extension 5/5     Special Tests    Special Tests Lumbar   Lumbar Tests Straight Leg Raise;Prone Knee Bend Test     Prone Knee Bend Test   Findings Positive   Side Right     Straight Leg Raise   Findings Negative                   [facility] Adult PT Treatment/Exercise - 06/25/16 1507      Knee/Hip Exercises: Stretches   Other Knee/Hip Stretches piriformis stretching of the R piriformis 2 x 30 sec     Manual Therapy   Manual Therapy Other (comment)   Manual therapy comments manual trigger point release over the R piriformis   Other Manual Therapy tack and stretch of the R piriformis                 PT Education - 06/25/16 1554    Education provided Yes   Education Details evaluation findings, POC, goals, HEP with proper form and rationale, TPDN handout   Person(s) Educated Patient   Methods Explanation;Verbal cues;Handout   Comprehension Verbalized understanding;Verbal cues required          PT Short Term Goals - 06/25/16 1602      PT SHORT TERM GOAL #1   Title pt will be I with inital HEP (07/16/2016)   Time 3   Period Weeks   Status New     PT SHORT TERM GOAL #2   Title pt will be able to verbalize and demo proper posture and lifting and carrying mechanics to prevent and reduce low back/ hip pain (07/16/2016)   Time 3   Period Weeks   Status New     PT SHORT TERM GOAL #3  Title pt will report decreased muscle  tightness in the R poster hip to decrease pain in the hip and reduce LE tingling (07/16/2016)   Time 3   Period Weeks   Status New           PT Long Term Goals - 06/25/16 1609      PT LONG TERM GOAL #1   Title pt will be I with all HEP given as of last visit ( 08/20/2016)   Time 6   Period Weeks   Status New     PT LONG TERM GOAL #2   Title pt will increase R hip extensor/ abductor strength to >/= 4/5 to promote safety with walking/ standing activities (08/20/2016)   Time 6   Period Weeks   Status New     PT LONG TERM GOAL #3   Title She will be able to sit for >/= 60 min with </= 1/10 pain in the hip and no report of tingling down the RLE (08/20/2016)   Time 6   Period Weeks   Status New     PT LONG TERM GOAL #4   Title she will increase her FOTO score to </= 28% limited to demonstrate improvement in function (08/20/2016)   Time 6   Period Weeks   Status New               Plan - 06/25/16 1556    Clinical Impression Statement Mrs. Alenah presents to OPPT as a low complexity evaluation. She demos functional trunk mobilty with no reproduction of symptoms. weakness noted in the R LE compared bil.  Tightness in the piriformis with reproduction of symptoms in the foot. She reported decreased tingling following manual trigger point release using tennis ball. She would benefit from physical therapy to decrease pain, improve sitting tolerance and promote maximum funciton by addressing the deficits listed.    Rehab Potential Good   PT Frequency 2x / week   PT Duration 6 weeks   PT Treatment/Interventions ADLs/Self Care Home Management;Cryotherapy;Electrical Stimulation;Iontophoresis 4mg /ml Dexamethasone;Moist Heat;Taping;Therapeutic activities;Therapeutic exercise;Ultrasound;Patient/family education;Dry needling;Manual techniques;Passive range of motion;Gait training   PT Next Visit Plan assess/ review HEP, manual, stretching of piriformis/ glute, core strengthening, RLE  strengthening. update HEP for exercises.    PT Home Exercise Plan pelvic tilts, piriformis stretching, manual trigger point release using tennis ball.    Consulted and Agree with Plan of Care Patient      Patient will benefit from skilled therapeutic intervention in order to improve the following deficits and impairments:  Pain, Improper body mechanics, Postural dysfunction, Decreased strength, Decreased endurance, Decreased activity tolerance, Decreased balance, Increased fascial restricitons  Visit Diagnosis: Other muscle spasm - Plan: PT plan of care cert/re-cert  Abnormal posture - Plan: PT plan of care cert/re-cert  Chronic right-sided low back pain, with sciatica presence unspecified - Plan: PT plan of care cert/re-cert  Pain in right hip - Plan: PT plan of care cert/re-cert      G-Codes - [id number]    Functional Assessment Tool Used (Outpatient Only) clincal judgement/ FOTO    Functional Limitation Changing and maintaining body position   Changing and Maintaining Body Position Current Status [id number]) At least 20 percent but less than 40 percent impaired, limited or restricted   Changing and Maintaining Body Position Goal Status [id number]) At least 1 percent but less than 20 percent impaired, limited or restricted       Problem List Patient Active Problem List   Diagnosis Date Noted  .  Sciatica of right side 05/19/2016  . Irritable bowel syndrome 12/29/2015  . Osteopenia 12/14/2015  . Depression 08/25/2013  . Back pain 11/13/2011  . Allergic rhinitis 11/13/2011  . Hearing loss 04/03/2011  . HTN (hypertension) 10/31/2010  . Anxiety 10/31/2010  . Insomnia 10/31/2010   Starr Lake PT, DPT, LAT, ATC  06/25/16  4:19 PM      [facility] [address] [place], [place], [address] Phone: [phone]   Fax:  [phone]  Name: Amber Garrett MRN: [id number] Date of Birth: 01/07/1941

## 2016-06-28 ENCOUNTER — Other Ambulatory Visit: Payer: Medicare Other

## 2016-06-29 ENCOUNTER — Ambulatory Visit
Admission: RE | Admit: 2016-06-29 | Discharge: 2016-06-29 | Disposition: A | Discharge status: Home / Self Care | Payer: Medicare Other | Source: Ambulatory Visit | Attending: Sports Medicine | Admitting: Sports Medicine

## 2016-06-29 ENCOUNTER — Ambulatory Visit
Admission: RE | Admit: 2016-06-29 | Discharge: 2016-06-29 | Disposition: A | Discharge status: Home / Self Care | Payer: Medicare Other | Source: Ambulatory Visit | Attending: Internal Medicine | Admitting: Internal Medicine

## 2016-06-29 DIAGNOSIS — R42 Dizziness and giddiness: Secondary | ICD-10-CM

## 2016-06-29 DIAGNOSIS — M48061 Spinal stenosis, lumbar region without neurogenic claudication: Secondary | ICD-10-CM | POA: Diagnosis not present

## 2016-06-29 DIAGNOSIS — M5136 Other intervertebral disc degeneration, lumbar region: Secondary | ICD-10-CM

## 2016-06-29 MED ORDER — GADOBENATE DIMEGLUMINE 529 MG/ML IV SOLN
14.0000 mL | Freq: Once | INTRAVENOUS | Status: AC | PRN
  Administered 2016-06-29: 14 mL via INTRAVENOUS

## 2016-07-01 ENCOUNTER — Encounter: Payer: Self-pay | Admitting: Internal Medicine

## 2016-07-01 ENCOUNTER — Ambulatory Visit (INDEPENDENT_AMBULATORY_CARE_PROVIDER_SITE_OTHER): Payer: Medicare Other | Admitting: Internal Medicine

## 2016-07-01 ENCOUNTER — Ambulatory Visit: Payer: Medicare Other | Admitting: Physical Therapy

## 2016-07-01 ENCOUNTER — Telehealth: Payer: Self-pay | Admitting: Internal Medicine

## 2016-07-01 VITALS — BP 124/64 | HR 72 | Ht 64.0 in | Wt 149.0 lb

## 2016-07-01 VITALS — BP 130/80 | HR 72 | Temp 98.0°F | Wt 149.0 lb

## 2016-07-01 DIAGNOSIS — R55 Syncope and collapse: Secondary | ICD-10-CM | POA: Diagnosis not present

## 2016-07-01 DIAGNOSIS — M25551 Pain in right hip: Secondary | ICD-10-CM | POA: Diagnosis not present

## 2016-07-01 DIAGNOSIS — M545 Low back pain: Secondary | ICD-10-CM | POA: Diagnosis not present

## 2016-07-01 DIAGNOSIS — G8929 Other chronic pain: Secondary | ICD-10-CM

## 2016-07-01 DIAGNOSIS — M62838 Other muscle spasm: Secondary | ICD-10-CM

## 2016-07-01 DIAGNOSIS — Z0000 Encounter for general adult medical examination without abnormal findings: Secondary | ICD-10-CM

## 2016-07-01 DIAGNOSIS — R293 Abnormal posture: Secondary | ICD-10-CM

## 2016-07-01 LAB — POCT URINALYSIS DIPSTICK
BILIRUBIN UA: NEGATIVE
Blood, UA: NEGATIVE
Glucose, UA: NEGATIVE
KETONES UA: NEGATIVE
LEUKOCYTES UA: NEGATIVE
Nitrite, UA: NEGATIVE
Protein, UA: NEGATIVE
Spec Grav, UA: 1.015
Urobilinogen, UA: NEGATIVE
pH, UA: 7.5

## 2016-07-01 MED ORDER — ESZOPICLONE 2 MG PO TABS: 2.0000 mg | ORAL_TABLET | Freq: Every evening | ORAL | 0 refills | Status: DC | PRN

## 2016-07-01 NOTE — Patient Instructions (Signed)
Medication Instructions:   Your physician recommends that you continue on your current medications as directed. Please refer to the Current Medication list given to you today.    If you need a refill on your cardiac medications before your next appointment, please call your pharmacy.  Labwork: NONE ORDERED  TODAY    Testing/Procedures: NONE ORDERED  TODAY    Follow-Up: CONTACT  OFFICE  BACK IF YOUR  WANTING TO GET A LOOP RECORDER PROCEDURE  SCHEDULED.   Any Other Special Instructions Will Be Listed Below (If Applicable).

## 2016-07-01 NOTE — Patient Instructions (Signed)
UTI resolved status post treatment

## 2016-07-01 NOTE — Patient Instructions (Signed)
Hip Extension (Prone)   Lift left leg _12___ inches from floor, keeping knee locked. Repeat __15__ times per set. Do ___1-2_ sets per session. Do __2__ sessions per day.  http://orth.exer.us/98   Copyright  VHI. All rights reserved.  Hip Adduction: Leg Lift (Eccentric) - Side-Lying   Lie on side with top leg bent, foot flat behind lower leg. Quickly lift lower leg. Slowly lower for 3-5 seconds. _15__ reps per set, _1-2__ sets per day, __7_ days per week.  Abduction: Side Leg Lift (Eccentric) - Side-Lying   Lie on side. Lift top leg slightly higher than shoulder level. Keep top leg straight with body, toes pointing forward. Slowly lower for 3-5 seconds. _15__ reps per set, _1-2__ sets per day, _7__ days per week.  Strengthening: Straight Leg Raise (Phase 1)   Tighten muscles on front of right thigh, then lift leg __12__ inches from surface, keeping knee locked.  Repeat __15_ times per set. Do ___1-2_ sets per session. Do _2___ sessions per day.  Abduction: Clam (Eccentric) - Side-Lying    Lie on side with knees bent. Lift top knee, keeping feet together. Keep trunk steady. Slowly lower for 3-5 seconds. 15___ reps per set, __1-2_ sets per day, _7__ days per week.  USE GREEN BAND AROUND KNEES
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
## 2016-07-01 NOTE — Telephone Encounter (Signed)
Patient called and states that the Lunesta 2mg  is working great.    She would like to get a full prescription of this called to her pharmacy please.  Thank you.

## 2016-07-01 NOTE — Telephone Encounter (Signed)
Call in #30 tablets of 2 mg Lunesta with directios: one po qhs with no refill

## 2016-07-01 NOTE — Progress Notes (Signed)
   Subjective:    Patient ID: Amber Garrett, female    DOB: 09/13/1940, 76 y.o.   MRN: [id number]  HPI For follow-up of urinary tract infection status post treatment. Sleeping well with Lunesta 2 mg at bedtime.  Was treated with Augmentin for an Escherichia coli UTI which was discovered on February 9.    Review of Systems     Objective:   Physical Exam  Not seen by physician. She is afebrile and blood pressure is normal.  Dipstick UA is normal      Assessment & Plan:  UTI-resolved after treatment   Plan: Return as needed. She is in the process of undergoing cardiac evaluation for recent episode of dizziness that was unexplained.

## 2016-07-01 NOTE — Therapy (Signed)
[place], [place], [address] Phone: [phone]   Fax:  [phone]  Physical Therapy Treatment  Patient Details  Name: Amber Garrett MRN: [id number] Date of Birth: 03-20-1941 Referring Provider: Lilia Argue MD  Encounter Date: 07/01/2016      PT End of Session - 07/01/16 1157    Visit Number 2   Number of Visits 13   Date for PT Re-Evaluation 08/20/16   Authorization Type Medicare: Kx mod by 15th visit, progress note by 10th visit   PT Start Time 1145   PT Stop Time 1224   PT Time Calculation (min) 39 min      Past Medical History:  Diagnosis Date  . HTN (hypertension)   . Hx: UTI (urinary tract infection)   . OA (osteoarthritis)   . Positive PPD   . Sciatica   . Vaginal atrophy     Past Surgical History:  Procedure Laterality Date  . APPENDECTOMY    . CESAREAN SECTION    . ENDOMETRIAL ABLATION    . INGUINAL HERNIA REPAIR      There were no vitals filed for this visit.      Subjective Assessment - 07/01/16 1147    Subjective I tried the tennis ball everyday and I think it has helped the pain and the tingling in my feet. I think my MRI showed a L3, L4 disc bulge.    Currently in Pain? Yes   Pain Score 2    Pain Location --  buttock   Pain Orientation Right   Pain Descriptors / Indicators Throbbing;Tightness   Aggravating Factors  prolonged sitting   Pain Relieving Factors standing, heat, ice                         [facility] Adult PT Treatment/Exercise - 07/01/16 0001      Knee/Hip Exercises: Stretches   Other Knee/Hip Stretches piriformis stretching of the R piriformis 2 x 30 sec   Other Knee/Hip Stretches figure four and knee to opposite shoulder      Knee/Hip Exercises: Supine   Bridges Limitations x 10 shoulder bridge    Other Supine Knee/Hip Exercises 4 way hip x 12 each      Knee/Hip Exercises: Sidelying   Clams green band clams x 15                  PT Education - 07/01/16 1227    Education provided Yes   Education Details HEP   Person(s) Educated Patient   Methods Explanation;Handout   Comprehension Verbalized understanding          PT Short Term Goals - 06/25/16 1602      PT SHORT TERM GOAL #1   Title pt will be I with inital HEP (07/16/2016)   Time 3   Period Weeks   Status New     PT SHORT TERM GOAL #2   Title pt will be able to verbalize and demo proper posture and lifting and carrying mechanics to prevent and reduce low back/ hip pain (07/16/2016)   Time 3   Period Weeks   Status New     PT SHORT TERM GOAL #3   Title pt will report decreased muscle tightness in the R poster hip to decrease pain in the hip and reduce LE tingling (07/16/2016)   Time 3   Period Weeks   Status New  PT Long Term Goals - 06/25/16 1609      PT LONG TERM GOAL #1   Title pt will be I with all HEP given as of last visit ( 08/20/2016)   Time 6   Period Weeks   Status New     PT LONG TERM GOAL #2   Title pt will increase R hip extensor/ abductor strength to >/= 4/5 to promote safety with walking/ standing activities (08/20/2016)   Time 6   Period Weeks   Status New     PT LONG TERM GOAL #3   Title She will be able to sit for >/= 60 min with </= 1/10 pain in the hip and no report of tingling down the RLE (08/20/2016)   Time 6   Period Weeks   Status New     PT LONG TERM GOAL #4   Title she will increase her FOTO score to </= 28% limited to demonstrate improvement in function (08/20/2016)   Time 6   Period Weeks   Status New               Plan - 07/01/16 1256    Clinical Impression Statement Pt reports less numbness in right foot since using tennis ball on piriformis. She just came from pilates and has already done her stretches. We began Rt hip strengthening and updated HEP. No increased pain, only muscles fatigue.    PT Next Visit Plan assess/ review HEP, manual, stretching of  piriformis/ glute, core strengthening, RLE strengthening. update HEP for exercises as needed.    PT Home Exercise Plan pelvic tilts, piriformis stretching, manual trigger point release using tennis ball.    Consulted and Agree with Plan of Care Patient      Patient will benefit from skilled therapeutic intervention in order to improve the following deficits and impairments:  Pain, Improper body mechanics, Postural dysfunction, Decreased strength, Decreased endurance, Decreased activity tolerance, Decreased balance, Increased fascial restricitons  Visit Diagnosis: Other muscle spasm  Abnormal posture  Chronic right-sided low back pain, with sciatica presence unspecified  Pain in right hip     Problem List Patient Active Problem List   Diagnosis Date Noted  . Sciatica of right side 05/19/2016  . Irritable bowel syndrome 12/29/2015  . Osteopenia 12/14/2015  . Depression 08/25/2013  . Back pain 11/13/2011  . Allergic rhinitis 11/13/2011  . Hearing loss 04/03/2011  . HTN (hypertension) 10/31/2010  . Anxiety 10/31/2010  . Insomnia 10/31/2010    Dorene Ar, PTA 07/01/2016, 1:00 PM  [place], [place], [address] Phone: [phone]   Fax:  [phone]  Name: Amber Garrett MRN: [id number] Date of Birth: [birth year]

## 2016-07-01 NOTE — Telephone Encounter (Signed)
Called in to [employer]

## 2016-07-03 ENCOUNTER — Telehealth: Payer: Self-pay | Admitting: Internal Medicine

## 2016-07-03 ENCOUNTER — Telehealth: Payer: Self-pay | Admitting: Sports Medicine

## 2016-07-03 NOTE — Telephone Encounter (Signed)
Patient is calling because she was seen by Dr. Cristopher Peru and he doesn't recommend any testing.  He does recommend the Loop Recorder.  Patient is not sure that she wants to do that.  She wants to get your opinion on that option.  She didn't make mention at all about Dr. Lovena Le not wanting her to drive for 6 months.  She just wants you to contact her and provide an opinion/advice on the Loop Recorder to her.    Thank you.    Best number for contact:  [phone]

## 2016-07-03 NOTE — Telephone Encounter (Signed)
Pt does not want to proceed with loop recorder. Says she was given no driving restrictions.

## 2016-07-03 NOTE — Telephone Encounter (Signed)
  I spoke with Amber Garrett on the phone yesterday after reviewing the MRI of her lumbar spine. She has scoliosis with some disc space narrowing and spurring on the left at L3-L4. She also has a small right foraminal disc protrusion at L4-L5. She has had 2 sessions of physical therapy and is responding favorably. At this point in time I recommend that she continue with physical therapy. Follow-up with me in 4 weeks. I reassured her that the bulging disc at [id number] is certainly not severe and at this point I do not think we need to pursue lumbar ESI's.

## 2016-07-04 ENCOUNTER — Ambulatory Visit: Payer: Medicare Other | Attending: Sports Medicine | Admitting: Physical Therapy

## 2016-07-04 DIAGNOSIS — R293 Abnormal posture: Secondary | ICD-10-CM | POA: Insufficient documentation

## 2016-07-04 DIAGNOSIS — M545 Low back pain: Secondary | ICD-10-CM | POA: Insufficient documentation

## 2016-07-04 DIAGNOSIS — G8929 Other chronic pain: Secondary | ICD-10-CM | POA: Insufficient documentation

## 2016-07-04 DIAGNOSIS — M62838 Other muscle spasm: Secondary | ICD-10-CM | POA: Insufficient documentation

## 2016-07-04 DIAGNOSIS — M25551 Pain in right hip: Secondary | ICD-10-CM | POA: Diagnosis not present

## 2016-07-04 NOTE — Therapy (Signed)
[place], [place], [address] Phone: [phone]   Fax:  [phone]  Physical Therapy Treatment  Patient Details  Name: Amber Garrett MRN: [id number] Date of Birth: 1940-08-10 Referring Provider: Lilia Argue MD  Encounter Date: 07/04/2016      PT End of Session - 07/04/16 1103    Visit Number 3   Number of Visits 13   PT Start Time 1100   PT Stop Time 4259   PT Time Calculation (min) 45 min      Past Medical History:  Diagnosis Date  . HTN (hypertension)   . Hx: UTI (urinary tract infection)   . OA (osteoarthritis)   . Positive PPD   . Sciatica   . Vaginal atrophy     Past Surgical History:  Procedure Laterality Date  . APPENDECTOMY    . CESAREAN SECTION    . ENDOMETRIAL ABLATION    . INGUINAL HERNIA REPAIR      There were no vitals filed for this visit.      Subjective Assessment - 07/04/16 1148    Subjective i got a masage yesterday. He said I had a tennis ball in my buttock.    Currently in Pain? No/denies                         [facility] Adult PT Treatment/Exercise - 07/04/16 0001      Knee/Hip Exercises: Stretches   Other Knee/Hip Stretches piriformis stretching of the R piriformis 2 x 30 sec   Other Knee/Hip Stretches figure four and knee to opposite shoulder      Knee/Hip Exercises: Supine   Bridges Limitations x 10 shoulder bridge    Bridges with [employer] 10 reps   Bridges with Clamshell 10 reps   Other Supine Knee/Hip Exercises 4 way hip x 15 each      Knee/Hip Exercises: Sidelying   Clams green band clams x 15    Other Sidelying Knee/Hip Exercises reverse clams yellow band x 15      Manual Therapy   Manual therapy comments manual trigger point release over the R piriformis                  PT Short Term Goals - 07/04/16 1149      PT SHORT TERM GOAL #1   Title pt will be I with inital HEP (07/16/2016)   Time 3   Period Weeks    Status Achieved     PT SHORT TERM GOAL #2   Title pt will be able to verbalize and demo proper posture and lifting and carrying mechanics to prevent and reduce low back/ hip pain (07/16/2016)   Time 3   Period Weeks   Status On-going     PT SHORT TERM GOAL #3   Title pt will report decreased muscle tightness in the R poster hip to decrease pain in the hip and reduce LE tingling (07/16/2016)   Time 3   Period Weeks   Status Achieved           PT Long Term Goals - 06/25/16 1609      PT LONG TERM GOAL #1   Title pt will be I with all HEP given as of last visit ( 08/20/2016)   Time 6   Period Weeks   Status New     PT LONG TERM GOAL #2   Title pt will increase R hip extensor/  abductor strength to >/= 4/5 to promote safety with walking/ standing activities (08/20/2016)   Time 6   Period Weeks   Status New     PT LONG TERM GOAL #3   Title She will be able to sit for >/= 60 min with </= 1/10 pain in the hip and no report of tingling down the RLE (08/20/2016)   Time 6   Period Weeks   Status New     PT LONG TERM GOAL #4   Title she will increase her FOTO score to </= 28% limited to demonstrate improvement in function (08/20/2016)   Time 6   Period Weeks   Status New               Plan - 07/04/16 1127    Clinical Impression Statement Pt reports she sat for 1.5 hours before feeling buttock pain yesterday during a Luncheon. She also has not noticed any N/T in her foot. She is independent with her HEP STG#1, #3 Met. She will be on vacation next week.   PT Next Visit Plan assess/ review HEP, manual, stretching of piriformis/ glute, core strengthening, RLE strengthening. update HEP for exercises as needed.    PT Home Exercise Plan pelvic tilts, piriformis stretching, manual trigger point release using tennis ball, 4 way SLR, clam with green band, reverse with yellow band    Consulted and Agree with Plan of Care Patient      Patient will benefit from skilled therapeutic  intervention in order to improve the following deficits and impairments:  Pain, Improper body mechanics, Postural dysfunction, Decreased strength, Decreased endurance, Decreased activity tolerance, Decreased balance, Increased fascial restricitons  Visit Diagnosis: Other muscle spasm  Abnormal posture  Chronic right-sided low back pain, with sciatica presence unspecified  Pain in right hip     Problem List Patient Active Problem List   Diagnosis Date Noted  . Sciatica of right side 05/19/2016  . Irritable bowel syndrome 12/29/2015  . Osteopenia 12/14/2015  . Depression 08/25/2013  . Back pain 11/13/2011  . Allergic rhinitis 11/13/2011  . Hearing loss 04/03/2011  . HTN (hypertension) 10/31/2010  . Anxiety 10/31/2010  . Insomnia 10/31/2010    Dorene Ar, PTA 07/04/2016, 11:53 AM  [facility] [address] [place], [place], [address] Phone: [phone]   Fax:  [phone]  Name: Amber Garrett MRN: [id number] Date of Birth: 17-Jun-1940

## 2016-07-08 ENCOUNTER — Other Ambulatory Visit: Payer: Self-pay | Admitting: Internal Medicine

## 2016-07-08 ENCOUNTER — Telehealth: Payer: Self-pay

## 2016-07-08 NOTE — Telephone Encounter (Signed)
Received a phone call from vascular requesting orders for patient's 24hr holter monitor and stress test, per Dr. Renold Genta patient had told her that she does not want to do theses tests bc now she's ok and she feels better. I called the patient to confirm and she refused these tests.

## 2016-07-14 ENCOUNTER — Encounter: Payer: Self-pay | Admitting: Physical Therapy

## 2016-07-14 ENCOUNTER — Ambulatory Visit: Payer: Medicare Other | Admitting: Physical Therapy

## 2016-07-14 DIAGNOSIS — R293 Abnormal posture: Secondary | ICD-10-CM | POA: Diagnosis not present

## 2016-07-14 DIAGNOSIS — M62838 Other muscle spasm: Secondary | ICD-10-CM | POA: Diagnosis not present

## 2016-07-14 DIAGNOSIS — M545 Low back pain: Secondary | ICD-10-CM

## 2016-07-14 DIAGNOSIS — G8929 Other chronic pain: Secondary | ICD-10-CM

## 2016-07-14 DIAGNOSIS — M25551 Pain in right hip: Secondary | ICD-10-CM | POA: Diagnosis not present

## 2016-07-14 NOTE — Patient Instructions (Addendum)

## 2016-07-14 NOTE — Therapy (Signed)
[place], [place], [address] Phone: [phone]   Fax:  [phone]  Physical Therapy Treatment  Patient Details  Name: Amber Garrett MRN: [id number] Date of Birth: November 09, 1940 Referring Provider: Lilia Argue MD  Encounter Date: 07/14/2016      PT End of Session - 07/14/16 1131    Visit Number 4   Number of Visits 13   Date for PT Re-Evaluation 08/20/16   Authorization Type Medicare: Kx mod by 15th visit, progress note by 10th visit   PT Start Time 1049   PT Stop Time 1130   PT Time Calculation (min) 41 min   Activity Tolerance Patient tolerated treatment well   Behavior During Therapy [facility] for tasks assessed/performed      Past Medical History:  Diagnosis Date  . HTN (hypertension)   . Hx: UTI (urinary tract infection)   . OA (osteoarthritis)   . Positive PPD   . Sciatica   . Vaginal atrophy     Past Surgical History:  Procedure Laterality Date  . APPENDECTOMY    . CESAREAN SECTION    . ENDOMETRIAL ABLATION    . INGUINAL HERNIA REPAIR      There were no vitals filed for this visit.      Subjective Assessment - 07/14/16 1055    Subjective "I am doing much better, the tennis ball technique has really helped"    Currently in Pain? No/denies   Aggravating Factors  prolonged sitting   Pain Relieving Factors standing, heat, ice                         [facility] Adult PT Treatment/Exercise - 07/14/16 0001      Lumbar Exercises: Seated   Other Seated Lumbar Exercises seated pelvic tilts on dyna disc 2 x 10     Knee/Hip Exercises: Stretches   Piriformis Stretch 2 reps;30 seconds     Knee/Hip Exercises: Aerobic   Nustep L6 x 5 min LE only     Knee/Hip Exercises: Supine   Bridges with Clamshell Strengthening;Both;2 sets;10 reps  with green theraband   Other Supine Knee/Hip Exercises 4 way hip x 15 each      Manual Therapy   Other Manual Therapy tack and stretch  of the R piriformis                 PT Education - 07/14/16 1130    Education provided Yes   Education Details posture education wiht lifting and carrying mechanics, updated HEP for pelvic tilt in sitting to promote spinal position.    Person(s) Educated Patient   Methods Explanation;Verbal cues;Handout   Comprehension Verbalized understanding;Verbal cues required          PT Short Term Goals - 07/04/16 1149      PT SHORT TERM GOAL #1   Title pt will be I with inital HEP (07/16/2016)   Time 3   Period Weeks   Status Achieved     PT SHORT TERM GOAL #2   Title pt will be able to verbalize and demo proper posture and lifting and carrying mechanics to prevent and reduce low back/ hip pain (07/16/2016)   Time 3   Period Weeks   Status On-going     PT SHORT TERM GOAL #3   Title pt will report decreased muscle tightness in the R poster hip to decrease pain in the hip and reduce LE tingling (07/16/2016)  Time 3   Period Weeks   Status Achieved           PT Long Term Goals - 06/25/16 1609      PT LONG TERM GOAL #1   Title pt will be I with all HEP given as of last visit ( 08/20/2016)   Time 6   Period Weeks   Status New     PT LONG TERM GOAL #2   Title pt will increase R hip extensor/ abductor strength to >/= 4/5 to promote safety with walking/ standing activities (08/20/2016)   Time 6   Period Weeks   Status New     PT LONG TERM GOAL #3   Title She will be able to sit for >/= 60 min with </= 1/10 pain in the hip and no report of tingling down the RLE (08/20/2016)   Time 6   Period Weeks   Status New     PT LONG TERM GOAL #4   Title she will increase her FOTO score to </= 28% limited to demonstrate improvement in function (08/20/2016)   Time [place] - 07/14/16 1131    Clinical Impression Statement Mrs. Jock's report she has reported decreased pain and tightness in the piriformis region with decreased  tingling in the back. Continued manual to calm down tightness. She was able to do all exercises with no report of pain or soreness. educated about posture while she maintained seated pelvic tilt which she reproted decreased soreness in the back .    PT Next Visit Plan assess/ review HEP, manual, stretching of piriformis/ glute, core strengthening, RLE strengthening. update HEP for exercises as needed.    PT Home Exercise Plan pelvic tilts, piriformis stretching, manual trigger point release using tennis ball, 4 way SLR, clam with green band, reverse with yellow band, seated pelvic tilt, posture education   Consulted and Agree with Plan of Care Patient      Patient will benefit from skilled therapeutic intervention in order to improve the following deficits and impairments:  Pain, Improper body mechanics, Postural dysfunction, Decreased strength, Decreased endurance, Decreased activity tolerance, Decreased balance, Increased fascial restricitons  Visit Diagnosis: Other muscle spasm  Abnormal posture  Chronic right-sided low back pain, with sciatica presence unspecified  Pain in right hip     Problem List Patient Active Problem List   Diagnosis Date Noted  . Sciatica of right side 05/19/2016  . Irritable bowel syndrome 12/29/2015  . Osteopenia 12/14/2015  . Depression 08/25/2013  . Back pain 11/13/2011  . Allergic rhinitis 11/13/2011  . Hearing loss 04/03/2011  . HTN (hypertension) 10/31/2010  . Anxiety 10/31/2010  . Insomnia 10/31/2010   Starr Lake PT, DPT, LAT, ATC  07/14/16  11:36 AM      [facility] [address] [place], [place], [address] Phone: [phone]   Fax:  [phone]  Name: ALANTIS BETHUNE MRN: [id number] Date of Birth: [birth year]

## 2016-07-16 ENCOUNTER — Ambulatory Visit: Payer: Medicare Other | Admitting: Physical Therapy

## 2016-07-18 ENCOUNTER — Other Ambulatory Visit: Payer: Self-pay | Admitting: Internal Medicine

## 2016-07-21 ENCOUNTER — Encounter: Payer: Self-pay | Admitting: Physical Therapy

## 2016-07-21 ENCOUNTER — Ambulatory Visit: Payer: Medicare Other | Admitting: Physical Therapy

## 2016-07-21 ENCOUNTER — Encounter: Payer: Self-pay | Admitting: Internal Medicine

## 2016-07-21 DIAGNOSIS — M545 Low back pain: Secondary | ICD-10-CM

## 2016-07-21 DIAGNOSIS — M25551 Pain in right hip: Secondary | ICD-10-CM

## 2016-07-21 DIAGNOSIS — M62838 Other muscle spasm: Secondary | ICD-10-CM

## 2016-07-21 DIAGNOSIS — Z1231 Encounter for screening mammogram for malignant neoplasm of breast: Secondary | ICD-10-CM | POA: Diagnosis not present

## 2016-07-21 DIAGNOSIS — G8929 Other chronic pain: Secondary | ICD-10-CM | POA: Diagnosis not present

## 2016-07-21 DIAGNOSIS — R293 Abnormal posture: Secondary | ICD-10-CM

## 2016-07-21 NOTE — Therapy (Signed)
[facility] [address] [place], [place], [address] Phone: [phone]   Fax:  [phone]  Physical Therapy Treatment  Patient Details  Name: Amber Garrett MRN: [id number] Date of Birth: 03/16/1941 Referring Provider: timothy Draper MD  Encounter Date: 07/21/2016      PT End of Session - 07/21/16 1233    Visit Number 5   Number of Visits 13   Date for PT Re-Evaluation 08/20/16   PT Start Time 1148   PT Stop Time 1230   PT Time Calculation (min) 42 min   Activity Tolerance Patient tolerated treatment well   Behavior During Therapy [facility] for tasks assessed/performed      Past Medical History:  Diagnosis Date  . HTN (hypertension)   . Hx: UTI (urinary tract infection)   . OA (osteoarthritis)   . Positive PPD   . Sciatica   . Vaginal atrophy     Past Surgical History:  Procedure Laterality Date  . APPENDECTOMY    . CESAREAN SECTION    . ENDOMETRIAL ABLATION    . INGUINAL HERNIA REPAIR      There were no vitals filed for this visit.      Subjective Assessment - 07/21/16 1148    Subjective "I am doing much better, haven't had long periods of sitting but overall better"   Currently in Pain? Yes   Pain Score 3    Pain Location Back   Pain Orientation Left;Lower   Pain Descriptors / Indicators Aching   Pain Onset More than a month ago   Pain Frequency Constant   Aggravating Factors  N/A                         [facility] Adult PT Treatment/Exercise - 07/21/16 0001      Lumbar Exercises: Quadruped   Other Quadruped Lumbar Exercises mule kicks 2 x 10  tactile cues for form     Knee/Hip Exercises: Stretches   Hip Flexor Stretch 3 reps;30 seconds  contract/ relax with 10 sec contraction     Knee/Hip Exercises: Aerobic   Nustep L5 x 5 min LE only     Knee/Hip Exercises: Seated   Hamstring Curl Left;2 sets;10 reps  green therabands     Knee/Hip Exercises: Supine   Bridges Limitations 2 x  10 with alternating Le extension  using yard stick for      Manual Therapy   Manual Therapy Muscle Energy Technique   Muscle Energy Technique scissor technique with resisted l hamsting curl and R hip flexor 5 x 10 sec hold                PT Education - 07/21/16 1236    Education provided Yes   Education Details updated HEP for innominated anterior rotation with hip flexor stretching and hamstring curls. anatomy of the SIJ and effects of specific muscles on the hip   Person(s) Educated Patient   Methods Explanation;Handout;Verbal cues   Comprehension Verbalized understanding;Verbal cues required          PT Short Term Goals - 07/04/16 1149      PT SHORT TERM GOAL #1   Title pt will be I with inital HEP (07/16/2016)   Time 3   Period Weeks   Status Achieved     PT SHORT TERM GOAL #2   Title pt will be able to verbalize and demo proper posture and lifting and carrying mechanics to prevent and   reduce low back/ hip pain (07/16/2016)   Time 3   Period Weeks   Status On-going     PT SHORT TERM GOAL #3   Title pt will report decreased muscle tightness in the R poster hip to decrease pain in the hip and reduce LE tingling (07/16/2016)   Time 3   Period Weeks   Status Achieved           PT Long Term Goals - 06/25/16 1609      PT LONG TERM GOAL #1   Title pt will be I with all HEP given as of last visit ( 08/20/2016)   Time 6   Period Weeks   Status New     PT LONG TERM GOAL #2   Title pt will increase R hip extensor/ abductor strength to >/= 4/5 to promote safety with walking/ standing activities (08/20/2016)   Time 6   Period Weeks   Status New     PT LONG TERM GOAL #3   Title She will be able to sit for >/= 60 min with </= 1/10 pain in the hip and no report of tingling down the RLE (08/20/2016)   Time 6   Period Weeks   Status New     PT LONG TERM GOAL #4   Title she will increase her FOTO score to </= 28% limited to demonstrate improvement in function  (08/20/2016)   Time 6   Period Weeks   Status New               Plan - 07/21/16 1233    Clinical Impression Statement [name]'s continues to make progress with treatment reporting no numbness inthe LLE. She exhibits an anterior rotated L innominate. performed hip stretching, and MET utilzing hamstring activation. She reported decreased pain in the low back and declined modalities.    PT Next Visit Plan assess/ review HEP, manual, stretching of piriformis/ glute, core strengthening, RLE strengthening. update HEP for exercises as needed.    Consulted and Agree with Plan of Care Patient      Patient will benefit from skilled therapeutic intervention in order to improve the following deficits and impairments:  Pain, Improper body mechanics, Postural dysfunction, Decreased strength, Decreased endurance, Decreased activity tolerance, Decreased balance, Increased fascial restricitons  Visit Diagnosis: Other muscle spasm  Abnormal posture  Chronic right-sided low back pain, with sciatica presence unspecified  Pain in right hip     Problem List Patient Active Problem List   Diagnosis Date Noted  . Sciatica of right side 05/19/2016  . Irritable bowel syndrome 12/29/2015  . Osteopenia 12/14/2015  . Depression 08/25/2013  . Back pain 11/13/2011  . Allergic rhinitis 11/13/2011  . Hearing loss 04/03/2011  . HTN (hypertension) 10/31/2010  . Anxiety 10/31/2010  . Insomnia 10/31/2010   Starr Lake PT, DPT, LAT, ATC  07/21/16  12:45 PM      [facility] [address] [place], [place], [address] Phone: [phone]   Fax:  [phone]  Name: Amber Garrett MRN: [id number] Date of Birth: 11/24/1940

## 2016-07-23 ENCOUNTER — Ambulatory Visit: Payer: Medicare Other | Admitting: Physical Therapy

## 2016-07-23 DIAGNOSIS — M25551 Pain in right hip: Secondary | ICD-10-CM

## 2016-07-23 DIAGNOSIS — M545 Low back pain: Secondary | ICD-10-CM

## 2016-07-23 DIAGNOSIS — R293 Abnormal posture: Secondary | ICD-10-CM

## 2016-07-23 DIAGNOSIS — G8929 Other chronic pain: Secondary | ICD-10-CM

## 2016-07-23 DIAGNOSIS — M62838 Other muscle spasm: Secondary | ICD-10-CM | POA: Diagnosis not present

## 2016-07-24 NOTE — Therapy (Signed)
[place], [place], [address] Phone: [phone]   Fax:  [phone]  Physical Therapy Treatment  Patient Details  Name: Amber Garrett MRN: [id number] Date of Birth: 07-Jul-1940 Referring Provider: Lilia Argue MD  Encounter Date: 07/23/2016      PT End of Session - 07/23/16 1158    Visit Number 6   Number of Visits 13   Date for PT Re-Evaluation 08/20/16   Authorization Type Medicare: Kx mod by 15th visit, progress note by 10th visit   PT Start Time 1147   PT Stop Time 1235   PT Time Calculation (min) 48 min      Past Medical History:  Diagnosis Date  . HTN (hypertension)   . Hx: UTI (urinary tract infection)   . OA (osteoarthritis)   . Positive PPD   . Sciatica   . Vaginal atrophy     Past Surgical History:  Procedure Laterality Date  . APPENDECTOMY    . CESAREAN SECTION    . ENDOMETRIAL ABLATION    . INGUINAL HERNIA REPAIR      There were no vitals filed for this visit.      Subjective Assessment - 07/23/16 1157    Currently in Pain? Yes   Pain Score 3    Pain Location Back   Pain Orientation Lower;Left   Pain Descriptors / Indicators Aching            [facility] PT Assessment - 07/23/16 0001      Strength   Right Hip Flexion 4-/5   Right Hip Extension 3+/5   Right Hip ABduction 4-/5   Left Hip Flexion 4/5   Left Hip Extension 4-/5   Left Hip ABduction 4-/5                     [facility] Adult PT Treatment/Exercise - 07/23/16 0001      Lumbar Exercises: Prone   Other Prone Lumbar Exercises used pillows under pelvis due to c/o lumbar pain. No pain      Lumbar Exercises: Quadruped   Opposite Arm/Leg Raise 10 reps   Other Quadruped Lumbar Exercises mule kicks 2 x 10  tactile cues for form     Knee/Hip Exercises: Supine   Bridges Limitations x 10 shoulder bridge   with yard stick, then with clams x 10 with yard stick    Other Supine Knee/Hip Exercises 4  way hip x 20 each   except flexion     Knee/Hip Exercises: Sidelying   Hip ABduction --                  PT Short Term Goals - 07/04/16 1149      PT SHORT TERM GOAL #1   Title pt will be I with inital HEP (07/16/2016)   Time 3   Period Weeks   Status Achieved     PT SHORT TERM GOAL #2   Title pt will be able to verbalize and demo proper posture and lifting and carrying mechanics to prevent and reduce low back/ hip pain (07/16/2016)   Time 3   Period Weeks   Status On-going     PT SHORT TERM GOAL #3   Title pt will report decreased muscle tightness in the R poster hip to decrease pain in the hip and reduce LE tingling (07/16/2016)   Time 3   Period Weeks   Status Achieved  PT Long Term Goals - 07/23/16 1240      PT LONG TERM GOAL #1   Title pt will be I with all HEP given as of last visit ( 08/20/2016)   Time 6   Period Weeks   Status On-going     PT LONG TERM GOAL #2   Title pt will increase R hip extensor/ abductor strength to >/= 4/5 to promote safety with walking/ standing activities (08/20/2016)   Time 6   Period Weeks   Status Partially Met     PT LONG TERM GOAL #3   Title She will be able to sit for >/= 60 min with </= 1/10 pain in the hip and no report of tingling down the RLE (08/20/2016)   Baseline does not sit that long without getting up/moving but does not think it would be an issue    Time 6   Period Weeks   Status Partially Met     PT LONG TERM GOAL #4   Title she will increase her FOTO score to </= 28% limited to demonstrate improvement in function (08/20/2016)   Time 6   Period Weeks   Status Unable to assess               Plan - 07/23/16 1221    Clinical Impression Statement Pt reports nearly resolved N/T in RLE. She was able to sit in church without pain or tingling. She will drive for 1 hour and 45 minutes soon. She will assess her tolerance. LTG#2, #3 partially met. Reviewed/progressed quadruped exercises from last  visit. Hip strength still weak bilateral.    PT Next Visit Plan assess/ review HEP, manual, stretching of piriformis/ glute, core strengthening, RLE strengthening. update HEP for exercises as needed.    PT Home Exercise Plan pelvic tilts, piriformis stretching, manual trigger point release using tennis ball, 4 way SLR, clam with green band, reverse with yellow band, seated pelvic tilt, posture education   Consulted and Agree with Plan of Care Patient      Patient will benefit from skilled therapeutic intervention in order to improve the following deficits and impairments:  Pain, Improper body mechanics, Postural dysfunction, Decreased strength, Decreased endurance, Decreased activity tolerance, Decreased balance, Increased fascial restricitons  Visit Diagnosis: Other muscle spasm  Abnormal posture  Chronic right-sided low back pain, with sciatica presence unspecified  Pain in right hip     Problem List Patient Active Problem List   Diagnosis Date Noted  . Sciatica of right side 05/19/2016  . Irritable bowel syndrome 12/29/2015  . Osteopenia 12/14/2015  . Depression 08/25/2013  . Back pain 11/13/2011  . Allergic rhinitis 11/13/2011  . Hearing loss 04/03/2011  . HTN (hypertension) 10/31/2010  . Anxiety 10/31/2010  . Insomnia 10/31/2010    Amber Garrett 07/24/2016, 7:20 AM  [facility] [address] [place], [place], [address] Phone: [phone]   Fax:  [phone]  Name: Amber Garrett MRN: [id number] Date of Birth: 1940-10-23

## 2016-07-28 ENCOUNTER — Ambulatory Visit: Payer: Medicare Other | Admitting: Physical Therapy

## 2016-07-28 ENCOUNTER — Encounter: Payer: Self-pay | Admitting: Physical Therapy

## 2016-07-28 DIAGNOSIS — M62838 Other muscle spasm: Secondary | ICD-10-CM | POA: Diagnosis not present

## 2016-07-28 DIAGNOSIS — R293 Abnormal posture: Secondary | ICD-10-CM | POA: Diagnosis not present

## 2016-07-28 DIAGNOSIS — M25551 Pain in right hip: Secondary | ICD-10-CM | POA: Diagnosis not present

## 2016-07-28 DIAGNOSIS — M545 Low back pain: Secondary | ICD-10-CM | POA: Diagnosis not present

## 2016-07-28 DIAGNOSIS — G8929 Other chronic pain: Secondary | ICD-10-CM

## 2016-07-28 NOTE — Therapy (Signed)
[place], [place], [address] Phone: [phone]   Fax:  [phone]  Physical Therapy Treatment / Discharge summary  Patient Details  Name: Amber Garrett MRN: [id number] Date of Birth: 1941-04-23 Referring Provider: Lilia Argue MD  Encounter Date: 07/28/2016      PT End of Session - 07/28/16 1221    Visit Number 7   Number of Visits 13   Date for PT Re-Evaluation 08/20/16   Authorization Type Medicare: Kx mod by 15th visit, progress note by 10th visit   PT Start Time 1145   PT Stop Time 1218  shortened visit due to discharge   PT Time Calculation (min) 33 min   Activity Tolerance Patient tolerated treatment well   Behavior During Therapy [facility] for tasks assessed/performed      Past Medical History:  Diagnosis Date  . HTN (hypertension)   . Hx: UTI (urinary tract infection)   . OA (osteoarthritis)   . Positive PPD   . Sciatica   . Vaginal atrophy     Past Surgical History:  Procedure Laterality Date  . APPENDECTOMY    . CESAREAN SECTION    . ENDOMETRIAL ABLATION    . INGUINAL HERNIA REPAIR      There were no vitals filed for this visit.      Subjective Assessment - 07/28/16 1151    Subjective "I am doing well and was able to drive to [place] with no issues"    Currently in Pain? No/denies   Aggravating Factors  N/A   Pain Relieving Factors exercise, heat, ice            [facility] PT Assessment - 07/28/16 0001      Observation/Other Assessments   Focus on Therapeutic Outcomes (FOTO)  28% limited     Strength   Right Hip Flexion 4-/5   Right Hip Extension 3+/5   Right Hip ABduction 4-/5   Right Hip ADduction 5/5   Left Hip Flexion 4/5   Left Hip Extension 4/5   Left Hip ABduction 4-/5                     [facility] Adult PT Treatment/Exercise - 07/28/16 0001      Knee/Hip Exercises: Standing   Hip ADduction Both;2 sets;10 reps   Hip Abduction  Stengthening;Both;2 sets;10 reps;Knee straight   Hip Extension 2 sets;10 reps;Both;Stengthening;Knee straight     Knee/Hip Exercises: Seated   Sit to Sand 2 sets;10 reps     Knee/Hip Exercises: Supine   Other Supine Knee/Hip Exercises 4 way hip x 20 each                 PT Education - 07/28/16 1220    Education provided Yes   Education Details reviewed previously provided HEP. updated hip strengtheing to standing with progressoin using theraband. edcuated regarding increasing reps/ sets to promote strength.    Person(s) Educated Patient   Methods Explanation;Verbal cues;Handout   Comprehension Verbalized understanding;Verbal cues required          PT Short Term Goals - 07/28/16 1221      PT SHORT TERM GOAL #1   Title pt will be I with inital HEP (07/16/2016)   Time 3   Period Weeks   Status Achieved     PT SHORT TERM GOAL #2   Title pt will be able to verbalize and demo proper posture and lifting and carrying mechanics to prevent and reduce  low back/ hip pain (07/16/2016)   Time 3   Period Weeks   Status Achieved     PT SHORT TERM GOAL #3   Title pt will report decreased muscle tightness in the R poster hip to decrease pain in the hip and reduce LE tingling (07/16/2016)   Time 3   Period Weeks   Status Achieved           PT Long Term Goals - August 14, 2016 1221      PT LONG TERM GOAL #1   Title pt will be I with all HEP given as of last visit ( 08/20/2016)   Time 6   Period Weeks   Status Achieved     PT LONG TERM GOAL #2   Title pt will increase R hip extensor/ abductor strength to >/= 4/5 to promote safety with walking/ standing activities (08/20/2016)   Time 6   Period Weeks   Status Partially Met     PT LONG TERM GOAL #3   Title She will be able to sit for >/= 60 min with </= 1/10 pain in the hip and no report of tingling down the RLE (08/20/2016)   Time 6   Period Weeks   Status Achieved     PT LONG TERM GOAL #4   Title she will increase her FOTO  score to </= 28% limited to demonstrate improvement in function (08/20/2016)   Time 6   Period Weeks   Status Achieved               Plan - 08/14/16 1221    Clinical Impression Statement Amber Garrett continues to make great progress with PT. She reports no numbness in the RLE or pain in the posterior hip and was able to drive to [facility] with no issues. She continues to demonstrate weakness in the RLE and reports intermittent L low back pain that pt reports is unrelated to what she was being seen for. she met or partially met all goals this visit. She reports she is able to maintain and progress her current level of function independently and will be discharged from PT today.    PT Next Visit Plan D/C   PT Home Exercise Plan pelvic tilts, piriformis stretching, manual trigger point release using tennis ball, 4 way SLR, clam with green band, reverse with yellow band, seated pelvic tilt, posture education, standing hip abduction/ adduction, extension, sit to stand   Consulted and Agree with Plan of Care Patient      Patient will benefit from skilled therapeutic intervention in order to improve the following deficits and impairments:  Pain, Improper body mechanics, Postural dysfunction, Decreased strength, Decreased endurance, Decreased activity tolerance, Decreased balance, Increased fascial restricitons  Visit Diagnosis: Other muscle spasm  Abnormal posture  Chronic right-sided low back pain, with sciatica presence unspecified  Pain in right hip       G-Codes - 08/14/16 1225    Functional Assessment Tool Used (Outpatient Only) clincal judgement/ FOTO    Functional Limitation Changing and maintaining body position   Changing and Maintaining Body Position Goal Status (J0300) At least 1 percent but less than 20 percent impaired, limited or restricted   Changing and Maintaining Body Position Discharge Status (P2330) At least 1 percent but less than 20 percent impaired, limited or  restricted      Problem List Patient Active Problem List   Diagnosis Date Noted  . Sciatica of right side 05/19/2016  . Irritable bowel syndrome 12/29/2015  .  Osteopenia 12/14/2015  . Depression 08/25/2013  . Back pain 11/13/2011  . Allergic rhinitis 11/13/2011  . Hearing loss 04/03/2011  . HTN (hypertension) 10/31/2010  . Anxiety 10/31/2010  . Insomnia 10/31/2010   Starr Lake PT, DPT, LAT, ATC  07/28/16  12:32 PM      [facility] [address] [place], [place], [address] Phone: [phone]   Fax:  [phone]  Name: ANONA GIOVANNINI MRN: [id number] Date of Birth: 01/02/1941       PHYSICAL THERAPY DISCHARGE SUMMARY  Visits from Start of Care: 7  Current functional level related to goals / functional outcomes: See goals, FOTO 28% limited   Remaining deficits: See above assessment   Education / Equipment: HEP, posture education, lifting biomechanics, theraband  Plan: Patient agrees to discharge.  Patient goals were met. Patient is being discharged due to being pleased with the current functional level.  ?????     Amber Garrett PT, DPT, LAT, ATC  07/28/16  12:34 PM

## 2016-07-30 ENCOUNTER — Ambulatory Visit: Payer: Medicare Other | Admitting: Physical Therapy

## 2016-08-07 DIAGNOSIS — L821 Other seborrheic keratosis: Secondary | ICD-10-CM | POA: Diagnosis not present

## 2016-08-07 DIAGNOSIS — D2239 Melanocytic nevi of other parts of face: Secondary | ICD-10-CM | POA: Diagnosis not present

## 2016-08-07 DIAGNOSIS — L814 Other melanin hyperpigmentation: Secondary | ICD-10-CM | POA: Diagnosis not present

## 2016-08-07 DIAGNOSIS — D225 Melanocytic nevi of trunk: Secondary | ICD-10-CM | POA: Diagnosis not present

## 2016-08-07 DIAGNOSIS — D2272 Melanocytic nevi of left lower limb, including hip: Secondary | ICD-10-CM | POA: Diagnosis not present

## 2016-08-21 ENCOUNTER — Other Ambulatory Visit: Payer: Self-pay

## 2016-08-21 MED ORDER — ESZOPICLONE 2 MG PO TABS: 2.0000 mg | ORAL_TABLET | Freq: Every evening | ORAL | 5 refills | Status: DC | PRN

## 2016-08-21 NOTE — Telephone Encounter (Signed)
Patient called to request a refill on LUNESTA 2mg , she said it is working for her, she takes half a tablet at bedtime.  Pt uses [facility].

## 2016-08-21 NOTE — Telephone Encounter (Signed)
Refill x 6 months 

## 2016-09-05 ENCOUNTER — Encounter: Payer: Self-pay | Admitting: Sports Medicine

## 2016-09-05 ENCOUNTER — Ambulatory Visit (INDEPENDENT_AMBULATORY_CARE_PROVIDER_SITE_OTHER): Payer: Medicare Other | Admitting: Sports Medicine

## 2016-09-05 VITALS — BP 124/63 | Ht 64.0 in | Wt 148.0 lb

## 2016-09-05 DIAGNOSIS — M5136 Other intervertebral disc degeneration, lumbar region: Secondary | ICD-10-CM

## 2016-09-05 MED ORDER — METHYLPREDNISOLONE ACETATE 80 MG/ML IJ SUSP
80.0000 mg | Freq: Once | INTRAMUSCULAR | Status: AC
  Administered 2016-09-05: 80 mg via INTRAMUSCULAR

## 2016-09-05 MED ORDER — PREDNISONE 10 MG PO TABS: ORAL_TABLET | ORAL | 0 refills | Status: DC

## 2016-09-05 NOTE — Progress Notes (Signed)
   Subjective:    Patient ID: Amber Garrett, female    DOB: [birth year], 76 y.o.   MRN: [id number]  HPI chief complaint: Left-sided low back pain  Patient comes in today complaining of 2 weeks of intermittent left-sided low back pain. She was recently treated for right leg lumbar radiculopathy. She responded very well to treatment with an IM cortisone injection and formal physical therapy. She has no pain in her right lower back or in the right leg. Her left-sided low back pain she localizes to the L4-L5 area. It is worse with changing positions. Once she is either stationary or moving her symptoms resolved. It is only during transitions from lying down to sitting up or sitting up to standing that she feels the discomfort. It does not radiate down her leg. There is no associated numbness or tingling. There is no groin pain. She is leaving for a trip to [place] early next week. She will be gone 3 weeks.      Review of Systems As above    Objective:   Physical Exam  Well-developed, well-nourished. No acute distress. Sitting comfortable in the exam room.  Lumbar spine: There is no tenderness to palpation or percussion along the lumbar midline. Slight tenderness to palpation diffusely just to the left of the L5-S1 vertebrae. No spasm. No tenderness over the SI joint. She has good lumbar range of motion but does have pain when transitioning from forward flexion to standing upright.  Neurological exam: Her strength is 5/5 in both lower extremities including with resisted right great toe extension which was her area of weakness on previous exam. Reflexes remain brisk and equal at the Achilles and patellar reflexes. Sensation is intact to light touch grossly.  Recent MRI of her lumbar spine showed scoliosis with disc space narrowing and spurring on the left at L3-L4 as well as a small right foraminal disc protrusion at L4-L5. Nothing acute.    Assessment & Plan:   Left-sided low back pain  secondary to scoliosis and lumbar degenerative disc disease Resolved right leg radiculopathy secondary to L4-L5 disc bulge  Patient admits that her pain is slowly improving. However, since she is leaving for a 3 week trip to [place] in a couple of days, I decided to reinject her with 80 mg of Depo-Medrol IM today. She has responded favorably to this in the past. I've also encouraged her to use moist heat as needed. I will give her a 6 day Sterapred Dosepak to take with her with instructions to use it only if her symptoms worsen while she is in [place]. Obviously, if symptoms persist or worsen, I need to see her back in the office once she returns to [place]. Otherwise, I will plan on seeing her PRN.

## 2016-10-17 ENCOUNTER — Other Ambulatory Visit: Payer: Self-pay | Admitting: Internal Medicine

## 2017-01-08 ENCOUNTER — Other Ambulatory Visit: Payer: Self-pay | Admitting: Internal Medicine

## 2017-01-08 NOTE — Telephone Encounter (Signed)
Due for CPE  In February. Please call and book medicare PE then and refill this med through February

## 2017-03-03 DIAGNOSIS — H52223 Regular astigmatism, bilateral: Secondary | ICD-10-CM | POA: Diagnosis not present

## 2017-03-03 DIAGNOSIS — H5213 Myopia, bilateral: Secondary | ICD-10-CM | POA: Diagnosis not present

## 2017-03-03 DIAGNOSIS — H26493 Other secondary cataract, bilateral: Secondary | ICD-10-CM | POA: Diagnosis not present

## 2017-03-03 DIAGNOSIS — H43813 Vitreous degeneration, bilateral: Secondary | ICD-10-CM | POA: Diagnosis not present

## 2017-03-03 DIAGNOSIS — H524 Presbyopia: Secondary | ICD-10-CM | POA: Diagnosis not present

## 2017-03-06 DIAGNOSIS — Z23 Encounter for immunization: Secondary | ICD-10-CM | POA: Diagnosis not present

## 2017-03-30 ENCOUNTER — Ambulatory Visit (INDEPENDENT_AMBULATORY_CARE_PROVIDER_SITE_OTHER): Payer: Medicare Other | Admitting: Sports Medicine

## 2017-03-30 ENCOUNTER — Other Ambulatory Visit: Payer: Self-pay | Admitting: Internal Medicine

## 2017-03-30 VITALS — BP 108/76 | Ht 64.0 in | Wt 151.0 lb

## 2017-03-30 DIAGNOSIS — M25561 Pain in right knee: Secondary | ICD-10-CM

## 2017-03-30 MED ORDER — MELOXICAM 15 MG PO TABS: ORAL_TABLET | ORAL | 1 refills | Status: DC

## 2017-03-30 NOTE — Progress Notes (Signed)
   [facility] Phone: [phone]  Subjective:  Amber Garrett is a 76 year old female presenting to clinic with right knee pain for the last 4 weeks. The pain started when she increased her walking speed on the treadmill, which caused her to have to take bigger steps. The pain started soon after that. The pain is located in her medial and posterior knee. The pain feels "uncomfortable" rather than painful. The pain is worse with walking and flexion at the knee. She has noticed swelling of the knee, but no erythema or warmth. She has been unable to walk due to the pain and has had to switch to using a stationary bike. She has tried ice, which helped. She also tried Naproxen, which didn't help. She denies any popping, locking, or catching of her knee.  ROS: See HPI for pertinent positives and negatives  Objective: BP 108/76   Ht 5\' 4"  (1.626 m)   Wt 151 lb (68.5 kg)   BMI 25.92 kg/m  Gen: NAD, alert, cooperative with exam Right Knee: No erythema or gross deformity. Mild joint effusion present. +tenderness to palpation along the medial joint line. Normal extension. Decreased flexion to 100-110 degrees. Mild crepitus appreciated. Valgus and varus stress tests negative. Anterior and posterior drawer tests negative. Thessaly's test positive for pain along the medial joint line. Neuro: Distal right lower extremity is neurovascularly intact.   Assessment/Plan: Right Knee Pain: Likely flare of osteoarthritis vs a degenerative medial meniscal tear, given her positive Thessaly's on exam. - Will treat conservatively with compression, ice, and Meloxicam daily for 7 days then prn - Patient given handout for quad strengthening exercises - Advised to modify activity for now - Discussed cortisone injection, but patient wanted to hold off on this for now - Follow-up in 3 weeks   Hyman Bible, MD PGY-3  Patient seen and evaluated with the resident. I agree with the above plan of care. I  discussed treatment options with the patient including cortisone injection, oral anti-inflammatories, compression, and home exercises. We will proceed with treatment as listed above. Follow-up in 3 weeks. If symptoms persist or worsen then we will reconsider merits of cortisone injection as well as entertain the idea of getting an x-ray of her knee specifically to evaluate the degree of osteoarthritis present. Patient is encouraged to call with questions or concerns prior to her follow-up visit.

## 2017-03-30 NOTE — Assessment & Plan Note (Signed)
Likely flare of osteoarthritis vs a degenerative medial meniscal tear, given her positive Thessaly's on exam. - Will treat conservatively with compression, ice, and Meloxicam daily for 7 days then prn - Patient given handout for quad strengthening exercises - Advised to modify activity for now - Discussed cortisone injection, but patient wanted to hold off on this for now - Follow-up in 3 weeks

## 2017-03-30 NOTE — Telephone Encounter (Signed)
Has appt in Feb. Refill x 90 days

## 2017-04-20 ENCOUNTER — Ambulatory Visit (INDEPENDENT_AMBULATORY_CARE_PROVIDER_SITE_OTHER): Payer: Medicare Other | Admitting: Sports Medicine

## 2017-04-20 DIAGNOSIS — M25561 Pain in right knee: Secondary | ICD-10-CM | POA: Diagnosis present

## 2017-04-20 MED ORDER — METHYLPREDNISOLONE ACETATE 40 MG/ML IJ SUSP
40.0000 mg | Freq: Once | INTRAMUSCULAR | Status: AC
  Administered 2017-04-20: 40 mg via INTRA_ARTICULAR

## 2017-04-20 NOTE — Progress Notes (Signed)
   HPI  CC: R knee pain  Here for follow up of R knee pain that is only slightly improved, still mostly in the back of her knee. Has been resting since last appointment and using compression sleeve which has greatly decreased swelling. Ice has been the most beneficial for her in terms of pain relief. Has not had much relief from meloxicam. Able to walk and has been able to move her knee more than before. No numbness/weakness. No locking or popping. Her goal is to get back to walking on treadmill daily.   Medications/Interventions Tried: compression sleeve, meloxicam, home exercises  See HPI and/or previous note for associated ROS.  Objective: BP 112/78   Ht 5\' 4"  (1.626 m)   Wt 147 lb (66.7 kg)   BMI 25.23 kg/m  R Knee: Inspection was negative for erythema or ecchymosis. Mild edema without effusion. No joint line tenderness. Flexion to 120 degrees. Normal extension. Mild crepitus appreciated. Valgus and varus stress tests negative. Thessaly again positive with medial stress but only with mild pain and improved from last visit.   Assessment and plan:  Acute pain of right knee Improved with rest and ice and compression. Likely osteoarthritis. Steroid injection today, see procedure note below. Follow up in 3 weeks if no improvement.  INJECTION: Patient was given informed consent, signed copy in the chart. Appropriate time out was taken. Area prepped and draped in usual sterile fashion. 1 cc of methylprednisolone 40 mg/ml plus  3 cc of 1% lidocaine without epinephrine was injected into the R knee joint using a medial approach. The patient tolerated the procedure well. There were no complications. Post procedure instructions were given.   Bufford Lope, DO  Patient seen and evaluated with the resident. I agree with the above plan of care. Patient's right knee was injected today with cortisone. An anterior medial approach was utilized. She tolerated this without difficulty. If symptoms persist  then I would start with getting an x-ray of her right knee to evaluate the degree of osteoarthritis. She should continue with her compression sleeve with activity and continue with her home exercises. Follow-up if symptoms persist or worsen. PGY-2, [facility] 04/20/2017 1:59 PM

## 2017-04-20 NOTE — Assessment & Plan Note (Signed)
Improved with rest and ice and compression. Likely osteoarthritis. Steroid injection today, see procedure note below. Follow up in 3 weeks if no improvement.

## 2017-05-06 ENCOUNTER — Telehealth: Payer: Self-pay

## 2017-05-06 ENCOUNTER — Ambulatory Visit
Admission: RE | Admit: 2017-05-06 | Discharge: 2017-05-06 | Disposition: A | Discharge status: Home / Self Care | Payer: Medicare Other | Source: Ambulatory Visit | Attending: Sports Medicine | Admitting: Sports Medicine

## 2017-05-06 ENCOUNTER — Other Ambulatory Visit: Payer: Self-pay | Admitting: Sports Medicine

## 2017-05-06 DIAGNOSIS — M25561 Pain in right knee: Secondary | ICD-10-CM

## 2017-05-06 NOTE — Telephone Encounter (Signed)
We will order an x-ray of her right knee and call her with the results. Pt understands.

## 2017-05-11 ENCOUNTER — Ambulatory Visit (INDEPENDENT_AMBULATORY_CARE_PROVIDER_SITE_OTHER): Payer: Medicare Other | Admitting: Sports Medicine

## 2017-05-11 ENCOUNTER — Encounter: Payer: Self-pay | Admitting: Sports Medicine

## 2017-05-11 VITALS — BP 110/74 | Ht 64.5 in | Wt 146.0 lb

## 2017-05-11 DIAGNOSIS — M25561 Pain in right knee: Secondary | ICD-10-CM | POA: Diagnosis not present

## 2017-05-11 NOTE — Progress Notes (Signed)
   Subjective:    Patient ID: Amber Garrett, female    DOB: 07-Dec-1940, 77 y.o.   MRN: [id number]  HPI  Patient comes in today at my request to discuss ongoing right knee pain. Pain began back in November after walking on a treadmill. She's been treated over the past several weeks with oral anti-inflammatories, home exercises, compression sleeve, and most recently a cortisone injection. Cortisone injection helped her for about 3 days. But her pain has now returned. It is all along the anterior medial knee. Present with weightbearing and walking. No pain at rest. Ice does help. Anti-inflammatories and Tylenol have not been of any benefit. Recent x-rays showed some mild medial joint space narrowing but otherwise unremarkable.    Review of Systems As above     Objective:   Physical Exam  Well-developed, well-nourished. No acute distress. Sitting comfortably in the exam room  Right knee: Range of motion 0-120. Trace effusion. She is tender to palpation along the medial joint line. No tenderness along the lateral joint line. Pain but no popping with McMurray's. Negative Thessaly's. Good joint stability. Neurovascular intact distally.  X-rays are as above      Assessment & Plan:  Persistent right knee pain secondary to medial compartmental DJD versus medial meniscal tear  Patient has not improved with conservative measures to date. Therefore, we will get an MRI of her knee specifically to rule out a meniscal tear. Phone follow-up with those results when available. We will delineate further treatment based on those findings. In the meantime, continue with her compression sleeve when active. Continue with icing intermittently as well.

## 2017-05-15 ENCOUNTER — Ambulatory Visit
Admission: RE | Admit: 2017-05-15 | Discharge: 2017-05-15 | Disposition: A | Discharge status: Home / Self Care | Payer: Medicare Other | Source: Ambulatory Visit | Attending: Sports Medicine | Admitting: Sports Medicine

## 2017-05-15 DIAGNOSIS — M25561 Pain in right knee: Secondary | ICD-10-CM | POA: Diagnosis not present

## 2017-05-18 ENCOUNTER — Other Ambulatory Visit: Payer: Self-pay

## 2017-05-18 ENCOUNTER — Telehealth: Payer: Self-pay | Admitting: Sports Medicine

## 2017-05-18 DIAGNOSIS — M25561 Pain in right knee: Secondary | ICD-10-CM

## 2017-05-18 NOTE — Telephone Encounter (Signed)
I spoke with the patient on the phone today after reviewing the MRI of her right knee. She has an extensive complex tear through the posterior horn of the medial meniscus. She also has a small nondisplaced subchondral fracture which is stable. This is all in the setting of moderate osteoarthritis. I recommended surgical consultation with Dr. Rhona Raider see if arthroscopy is warranted. Further treatment will be per their discretion of Dr. Rhona Raider the patient will follow-up with me as needed.

## 2017-05-19 ENCOUNTER — Other Ambulatory Visit: Payer: Self-pay | Admitting: Internal Medicine

## 2017-05-19 DIAGNOSIS — I10 Essential (primary) hypertension: Secondary | ICD-10-CM

## 2017-05-19 DIAGNOSIS — M8580 Other specified disorders of bone density and structure, unspecified site: Secondary | ICD-10-CM

## 2017-05-19 DIAGNOSIS — Z8639 Personal history of other endocrine, nutritional and metabolic disease: Secondary | ICD-10-CM

## 2017-05-19 DIAGNOSIS — Z1329 Encounter for screening for other suspected endocrine disorder: Secondary | ICD-10-CM

## 2017-05-19 DIAGNOSIS — Z0000 Encounter for general adult medical examination without abnormal findings: Secondary | ICD-10-CM

## 2017-05-20 DIAGNOSIS — M25561 Pain in right knee: Secondary | ICD-10-CM | POA: Diagnosis not present

## 2017-05-20 DIAGNOSIS — M1711 Unilateral primary osteoarthritis, right knee: Secondary | ICD-10-CM | POA: Diagnosis not present

## 2017-06-12 DIAGNOSIS — M25561 Pain in right knee: Secondary | ICD-10-CM | POA: Diagnosis not present

## 2017-06-15 ENCOUNTER — Other Ambulatory Visit: Payer: Medicare Other | Admitting: Internal Medicine

## 2017-06-15 DIAGNOSIS — Z8639 Personal history of other endocrine, nutritional and metabolic disease: Secondary | ICD-10-CM

## 2017-06-15 DIAGNOSIS — Z1329 Encounter for screening for other suspected endocrine disorder: Secondary | ICD-10-CM

## 2017-06-15 DIAGNOSIS — Z0000 Encounter for general adult medical examination without abnormal findings: Secondary | ICD-10-CM

## 2017-06-15 DIAGNOSIS — M8580 Other specified disorders of bone density and structure, unspecified site: Secondary | ICD-10-CM

## 2017-06-15 DIAGNOSIS — I10 Essential (primary) hypertension: Secondary | ICD-10-CM

## 2017-06-16 ENCOUNTER — Ambulatory Visit (INDEPENDENT_AMBULATORY_CARE_PROVIDER_SITE_OTHER): Payer: Medicare Other | Admitting: Internal Medicine

## 2017-06-16 ENCOUNTER — Encounter: Payer: Self-pay | Admitting: Internal Medicine

## 2017-06-16 VITALS — BP 120/80 | HR 86 | Temp 99.6°F | Ht 64.0 in | Wt 145.0 lb

## 2017-06-16 DIAGNOSIS — I071 Rheumatic tricuspid insufficiency: Secondary | ICD-10-CM

## 2017-06-16 DIAGNOSIS — F411 Generalized anxiety disorder: Secondary | ICD-10-CM

## 2017-06-16 DIAGNOSIS — Z87828 Personal history of other (healed) physical injury and trauma: Secondary | ICD-10-CM

## 2017-06-16 DIAGNOSIS — F439 Reaction to severe stress, unspecified: Secondary | ICD-10-CM

## 2017-06-16 DIAGNOSIS — Z0000 Encounter for general adult medical examination without abnormal findings: Secondary | ICD-10-CM | POA: Diagnosis not present

## 2017-06-16 DIAGNOSIS — Z20828 Contact with and (suspected) exposure to other viral communicable diseases: Secondary | ICD-10-CM

## 2017-06-16 DIAGNOSIS — Z8739 Personal history of other diseases of the musculoskeletal system and connective tissue: Secondary | ICD-10-CM | POA: Diagnosis not present

## 2017-06-16 DIAGNOSIS — Z87898 Personal history of other specified conditions: Secondary | ICD-10-CM

## 2017-06-16 DIAGNOSIS — I10 Essential (primary) hypertension: Secondary | ICD-10-CM | POA: Diagnosis not present

## 2017-06-16 DIAGNOSIS — K589 Irritable bowel syndrome without diarrhea: Secondary | ICD-10-CM | POA: Diagnosis not present

## 2017-06-16 DIAGNOSIS — H903 Sensorineural hearing loss, bilateral: Secondary | ICD-10-CM | POA: Diagnosis not present

## 2017-06-16 LAB — COMPLETE METABOLIC PANEL WITH GFR
AG Ratio: 2 (calc) (ref 1.0–2.5)
ALBUMIN MSPROF: 4.5 g/dL (ref 3.6–5.1)
ALKALINE PHOSPHATASE (APISO): 54 U/L (ref 33–130)
ALT: 11 U/L (ref 6–29)
AST: 16 U/L (ref 10–35)
BILIRUBIN TOTAL: 0.6 mg/dL (ref 0.2–1.2)
BUN / CREAT RATIO: 24 (calc) — AB (ref 6–22)
BUN: 23 mg/dL (ref 7–25)
CHLORIDE: 104 mmol/L (ref 98–110)
CO2: 26 mmol/L (ref 20–32)
CREATININE: 0.94 mg/dL — AB (ref 0.60–0.93)
Calcium: 9.7 mg/dL (ref 8.6–10.4)
GFR, Est African American: 69 mL/min/{1.73_m2} (ref >=60)
GFR, Est Non African American: 60 mL/min/{1.73_m2} (ref >=60)
GLOBULIN: 2.3 g/dL (ref 1.9–3.7)
GLUCOSE: 96 mg/dL (ref 65–99)
Potassium: 4.3 mmol/L (ref 3.5–5.3)
SODIUM: 137 mmol/L (ref 135–146)
Total Protein: 6.8 g/dL (ref 6.1–8.1)

## 2017-06-16 LAB — LIPID PANEL
CHOL/HDL RATIO: 2.7 (calc) (ref <5.0)
Cholesterol: 198 mg/dL (ref <200)
HDL: 74 mg/dL (ref >50)
LDL Cholesterol (Calc): 106 mg/dL (calc) — ABNORMAL HIGH
Non-HDL Cholesterol (Calc): 124 mg/dL (calc) (ref <130)
TRIGLYCERIDES: 88 mg/dL (ref <150)

## 2017-06-16 LAB — CBC WITH DIFFERENTIAL/PLATELET
BASOS PCT: 0.4 %
Basophils Absolute: 28 cells/uL (ref 0–200)
EOS PCT: 2 %
Eosinophils Absolute: 140 cells/uL (ref 15–500)
HCT: 41.2 % (ref 35.0–45.0)
Hemoglobin: 13.8 g/dL (ref 11.7–15.5)
Lymphs Abs: 2310 cells/uL (ref 850–3900)
MCH: 30.9 pg (ref 27.0–33.0)
MCHC: 33.5 g/dL (ref 32.0–36.0)
MCV: 92.2 fL (ref 80.0–100.0)
MONOS PCT: 9.5 %
MPV: 10.8 fL (ref 7.5–12.5)
NEUTROS PCT: 55.1 %
Neutro Abs: 3857 cells/uL (ref 1500–7800)
Platelets: 290 10*3/uL (ref 140–400)
RBC: 4.47 10*6/uL (ref 3.80–5.10)
RDW: 12.2 % (ref 11.0–15.0)
TOTAL LYMPHOCYTE: 33 %
WBC mixed population: 665 cells/uL (ref 200–950)
WBC: 7 10*3/uL (ref 3.8–10.8)

## 2017-06-16 LAB — TSH: TSH: 1.89 m[IU]/L (ref 0.40–4.50)

## 2017-06-16 LAB — VITAMIN D 25 HYDROXY (VIT D DEFICIENCY, FRACTURES): Vit D, 25-Hydroxy: 48 ng/mL (ref 30–100)

## 2017-06-16 MED ORDER — OSELTAMIVIR PHOSPHATE 75 MG PO CAPS: 75.0000 mg | ORAL_CAPSULE | Freq: Two times a day (BID) | ORAL | 0 refills | Status: DC

## 2017-06-16 MED ORDER — HYDROCODONE-HOMATROPINE 5-1.5 MG/5ML PO SYRP: 5.0000 mL | ORAL_SOLUTION | Freq: Three times a day (TID) | ORAL | 0 refills | Status: DC | PRN

## 2017-06-16 MED ORDER — AZITHROMYCIN 250 MG PO TABS: ORAL_TABLET | ORAL | 0 refills | Status: DC

## 2017-07-01 NOTE — Progress Notes (Signed)
Subjective:    Patient ID: Amber Garrett, female    DOB: Sep 19, 1940, 77 y.o.   MRN: [id number]  HPI 77 year old White Female in today for health maintenance exam and evaluation of medical issues.  Husband lives at [place].  He has multiple sclerosis and dementia.  Has recently been diagnosed with pneumonia and influenza and of course, she is quite worried about him.  History of irritable bowel type symptoms that resolved with taking as needed Levsin.  She takes Lunesta to sleep.  Long-standing history of insomnia.  She takes lisinopril 5 mg daily for hypertension.  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  History of sciatica and osteopenia.    Past medical history: Possible allergy to Macrodantin because it causes rash on arms only.  History of C-section and incisional hernia with appendectomy 1973.  C-section in [place] in 1970.  Colonoscopy done in 2007 by Dr. Cristina Gong showing only diverticulosis.  She saw Dr. Micheline Chapman in January 2019 for right knee torn posterior horn of medial meniscus and subchondral fracture off the medial tibial plateau by MRI in 2019.  Knee was injected.  No known trauma to cause this.  She had 2D echocardiogram in 2006 to evaluate cardiac murmur.  She had moderate tricuspid regurgitation and elevation of right ventricular systolic pressure at 72-53 mm consistent with mild pulmonary hypertension.  Mitral valve leaflets were thickened but opens well without stenosis.  Trace mitral regurgitation.  Bone density study January 2012 showed osteopenia.  Social history: She has a Gaffer.  Does not smoke.  Social alcohol consumption.  She exercises regularly and enjoys traveling.  Her husband is a retired Forensic psychologist who has multiple sclerosis and dementia.    Family history: Father died at age 32 with pulmonary fibrosis.  Mother died with history of  hypertension, heart issues, bradycardia, C. difficile and MRSA.  One brother in good health.  A son and daughter in good health.  Had cataract extractions by Dr. Bing Plume August 2014 in October 2014.      Review of Systems no new complaints other than the issue     Objective:   Physical Exam  Constitutional: She is oriented to person, place, and time. She appears well-developed and well-nourished. No distress.  HENT:  Head: Normocephalic and atraumatic.  Right Ear: External ear normal.  Left Ear: External ear normal.  Mouth/Throat: Oropharynx is clear and moist.  Eyes: Conjunctivae and EOM are normal. Pupils are equal, round, and reactive to light. Right eye exhibits no discharge. Left eye exhibits no discharge. No scleral icterus.  Neck: Neck supple. No JVD present. No thyromegaly present.  Cardiovascular: Normal rate, regular rhythm and normal heart sounds.  No murmur heard. Pulmonary/Chest: Effort normal and breath sounds normal. No respiratory distress. She has no wheezes. She has no rales.  Abdominal: Soft. Bowel sounds are normal. She exhibits no distension and no mass. There is no tenderness. There is no rebound and no guarding.  Genitourinary:  Genitourinary Comments: Last Pap done in 2012.  Bimanual normal.  Musculoskeletal: She exhibits no edema.  Lymphadenopathy:    She has no cervical adenopathy.  Neurological: She is alert and oriented to person, place, and time. She has normal reflexes. No cranial nerve deficit. Coordination normal.  Skin: Skin is warm and dry. No rash noted. She is not diaphoretic.  Psychiatric: She has a normal mood and affect. Her behavior is normal. Judgment  and thought content normal.  Vitals reviewed.         Assessment & Plan:  Recent right torn medial meniscus and subchondral fracture-slowly improving  Hypertension-stable at 120/80 today.  Osteopenia  History of irritable bowel syndrome treated with Levsin  Cardiac murmur found to  have moderate tricuspid regurgitation and and mild pulmonary hypertension in 2006  Anxiety  Insomnia-treated with Lunesta  Hearing loss  History positive PPD with negative chest x-ray  Osteoarthritis  History of sciatica  Allergic rhinitis  Plan: Patient is doing well on current regimen.  Return in 1 year or as needed.  Flu exposure with husband.  I have given her Tamiflu and a Zithromax Z-Pak to have on hand  Subjective:   Patient presents for Medicare Annual/Subsequent preventive examination.  Review Past Medical/Family/Social: See above   Risk Factors  Current exercise habits: Tries to get some regular exercise with walking Dietary issues discussed: Low-fat low carbohydrate  Cardiac risk factors: Hypertension, family history in mother  Depression Screen  (Note: if answer to either of the following is "Yes", a more complete depression screening is indicated)   Over the past two weeks, have you felt down, depressed or hopeless? No  Over the past two weeks, have you felt little interest or pleasure in doing things? No Have you lost interest or pleasure in daily life? No Do you often feel hopeless? No Do you cry easily over simple problems? No   Activities of Daily Living  In your present state of health, do you have any difficulty performing the following activities?:   Driving? No  Managing money? No  Feeding yourself? No  Getting from bed to chair? No  Climbing a flight of stairs? No  Preparing food and eating?: No  Bathing or showering? No  Getting dressed: No  Getting to the toilet? No  Using the toilet:No  Moving around from place to place: No  In the past year have you fallen or had a near fall?:No  Are you sexually active? No  Do you have more than one partner? No   Hearing Difficulties:  yes Do you often ask people to speak up or repeat themselves?  Yes Do you experience ringing or noises in your ears? No  Do you have difficulty understanding soft  or whispered voices?  yes Do you feel that you have a problem with memory? No Do you often misplace items? No    Home Safety:  Do you have a smoke alarm at your residence? Yes Do you have grab bars in the bathroom?  No  Do you have throw rugs in your house?  No   Cognitive Testing  Alert? Yes Normal Appearance?Yes  Oriented to person? Yes Place? Yes  Time? Yes  Recall of three objects? Yes  Can perform simple calculations? Yes  Displays appropriate judgment?Yes  Can read the correct time from a watch face?Yes   List the Names of Other Physician/Practitioners you currently use:  See referral list for the physicians patient is currently seeing.  Orthopedist   Review of Systems: See above   Objective:     General appearance: Appears younger than stated age  Head: Normocephalic, without obvious abnormality, atraumatic  Eyes: conj clear, EOMi PEERLA  Ears: normal TM's and external ear canals both ears  Nose: Nares normal. Septum midline. Mucosa normal. No drainage or sinus tenderness.  Throat: lips, mucosa, and tongue normal; teeth and gums normal  Neck: no adenopathy, no carotid bruit, no JVD, supple,  symmetrical, trachea midline and thyroid not enlarged, symmetric, no tenderness/mass/nodules  No CVA tenderness.  Lungs: clear to auscultation bilaterally  Breasts: normal appearance, no masses or tenderness Heart: regular rate and rhythm, S1, S2 normal, no murmur, click, rub or gallop  Abdomen: soft, non-tender; bowel sounds normal; no masses, no organomegaly  Musculoskeletal: ROM normal in all joints, no crepitus, no deformity, Normal muscle strengthen. Back  is symmetric, no curvature. Skin: Skin color, texture, turgor normal. No rashes or lesions  Lymph nodes: Cervical, supraclavicular, and axillary nodes normal.  Neurologic: CN 2 -12 Normal, Normal symmetric reflexes. Normal coordination and gait  Psych: Alert & Oriented x 3, Mood appear stable.    Assessment:     Annual wellness medicare exam   Plan:    During the course of the visit the patient was educated and counseled about appropriate screening and preventive services including:   Annual mammogram  Annual flu vaccine     Patient Instructions (the written plan) was given to the patient.  Medicare Attestation  I have personally reviewed:  The patient's medical and social history  Their use of alcohol, tobacco or illicit drugs  Their current medications and supplements  The patient's functional ability including ADLs,fall risks, home safety risks, cognitive, and hearing and visual impairment  Diet and physical activities  Evidence for depression or mood disorders  The patient's weight, height, BMI, and visual acuity have been recorded in the chart. I have made referrals, counseling, and provided education to the patient based on review of the above and I have provided the patient with a written personalized care plan for preventive services.

## 2017-07-01 NOTE — Patient Instructions (Addendum)
It was a pleasure to see you today.  Continue same medications for hypertension and return in 1 year or as needed.  Continue  Lunesta for sleep.  Prescription for Tamiflu and Z-Pak if needed

## 2017-07-13 ENCOUNTER — Other Ambulatory Visit: Payer: Self-pay | Admitting: Internal Medicine

## 2017-08-17 DIAGNOSIS — Z1231 Encounter for screening mammogram for malignant neoplasm of breast: Secondary | ICD-10-CM | POA: Diagnosis not present

## 2017-08-17 DIAGNOSIS — M8589 Other specified disorders of bone density and structure, multiple sites: Secondary | ICD-10-CM | POA: Diagnosis not present

## 2017-08-19 ENCOUNTER — Telehealth: Payer: Self-pay | Admitting: Internal Medicine

## 2017-08-19 ENCOUNTER — Encounter: Payer: Self-pay | Admitting: Internal Medicine

## 2017-08-19 NOTE — Telephone Encounter (Signed)
Mailed copy of Bone Density Report 08/19/17

## 2017-08-20 ENCOUNTER — Other Ambulatory Visit: Payer: Self-pay | Admitting: Internal Medicine

## 2017-08-20 NOTE — Telephone Encounter (Signed)
Refill x 6 months 

## 2017-08-26 ENCOUNTER — Encounter: Payer: Self-pay | Admitting: Internal Medicine

## 2017-09-08 DIAGNOSIS — M25561 Pain in right knee: Secondary | ICD-10-CM | POA: Diagnosis not present

## 2017-09-30 DIAGNOSIS — L821 Other seborrheic keratosis: Secondary | ICD-10-CM | POA: Diagnosis not present

## 2017-09-30 DIAGNOSIS — L905 Scar conditions and fibrosis of skin: Secondary | ICD-10-CM | POA: Diagnosis not present

## 2017-09-30 DIAGNOSIS — D2239 Melanocytic nevi of other parts of face: Secondary | ICD-10-CM | POA: Diagnosis not present

## 2017-09-30 DIAGNOSIS — B353 Tinea pedis: Secondary | ICD-10-CM | POA: Diagnosis not present

## 2017-09-30 DIAGNOSIS — R208 Other disturbances of skin sensation: Secondary | ICD-10-CM | POA: Diagnosis not present

## 2017-10-28 DIAGNOSIS — Y999 Unspecified external cause status: Secondary | ICD-10-CM | POA: Diagnosis not present

## 2017-10-28 DIAGNOSIS — X58XXXA Exposure to other specified factors, initial encounter: Secondary | ICD-10-CM | POA: Diagnosis not present

## 2017-10-28 DIAGNOSIS — M94261 Chondromalacia, right knee: Secondary | ICD-10-CM | POA: Diagnosis not present

## 2017-10-28 DIAGNOSIS — M6751 Plica syndrome, right knee: Secondary | ICD-10-CM | POA: Diagnosis not present

## 2017-10-28 DIAGNOSIS — S83231A Complex tear of medial meniscus, current injury, right knee, initial encounter: Secondary | ICD-10-CM | POA: Diagnosis not present

## 2017-10-28 DIAGNOSIS — G8918 Other acute postprocedural pain: Secondary | ICD-10-CM | POA: Diagnosis not present

## 2017-10-28 DIAGNOSIS — S83241A Other tear of medial meniscus, current injury, right knee, initial encounter: Secondary | ICD-10-CM | POA: Diagnosis not present

## 2017-11-02 DIAGNOSIS — S83231D Complex tear of medial meniscus, current injury, right knee, subsequent encounter: Secondary | ICD-10-CM | POA: Diagnosis not present

## 2017-11-02 DIAGNOSIS — M6751 Plica syndrome, right knee: Secondary | ICD-10-CM | POA: Diagnosis not present

## 2017-11-02 DIAGNOSIS — M25561 Pain in right knee: Secondary | ICD-10-CM | POA: Diagnosis not present

## 2017-11-02 DIAGNOSIS — M1711 Unilateral primary osteoarthritis, right knee: Secondary | ICD-10-CM | POA: Diagnosis not present

## 2017-11-09 DIAGNOSIS — M1711 Unilateral primary osteoarthritis, right knee: Secondary | ICD-10-CM | POA: Diagnosis not present

## 2017-11-12 DIAGNOSIS — M1711 Unilateral primary osteoarthritis, right knee: Secondary | ICD-10-CM | POA: Diagnosis not present

## 2017-11-12 DIAGNOSIS — M25561 Pain in right knee: Secondary | ICD-10-CM | POA: Diagnosis not present

## 2017-11-12 DIAGNOSIS — S83231D Complex tear of medial meniscus, current injury, right knee, subsequent encounter: Secondary | ICD-10-CM | POA: Diagnosis not present

## 2017-11-12 DIAGNOSIS — M6751 Plica syndrome, right knee: Secondary | ICD-10-CM | POA: Diagnosis not present

## 2017-12-23 ENCOUNTER — Other Ambulatory Visit: Payer: Self-pay | Admitting: Internal Medicine

## 2017-12-23 NOTE — Telephone Encounter (Signed)
Book CPE for February before refilling

## 2018-02-03 IMAGING — MR MR KNEE*R* W/O CM
4 of 6 series · 19 of 40 positions shown · non-contrast
Comparison: Plain films right knee 05/06/2017.

CLINICAL DATA: Medial right knee pain with swelling, popping and
cracking for 9 weeks. No known injury.

EXAM:
MRI OF THE RIGHT KNEE WITHOUT CONTRAST
TECHNIQUE: Multiplanar, multisequence MR imaging of the knee was performed. No
intravenous contrast was administered.

[Series 3: PD fat-sat · axial · 4.0mm · 0.31mm/px · z∈[-40,+74]mm · 7 of 27 slices shown (1 of 4)]
[im 1/27]
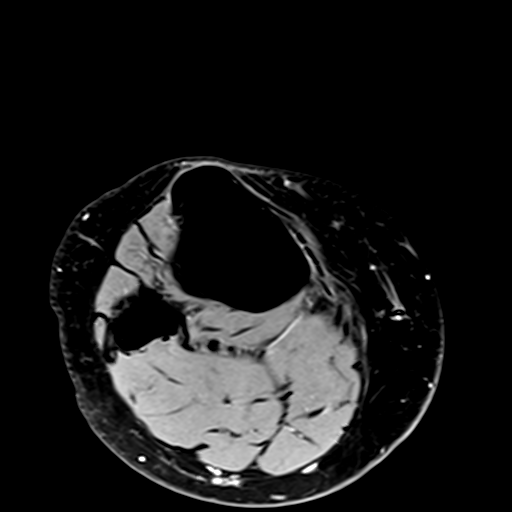
[im 5/27]
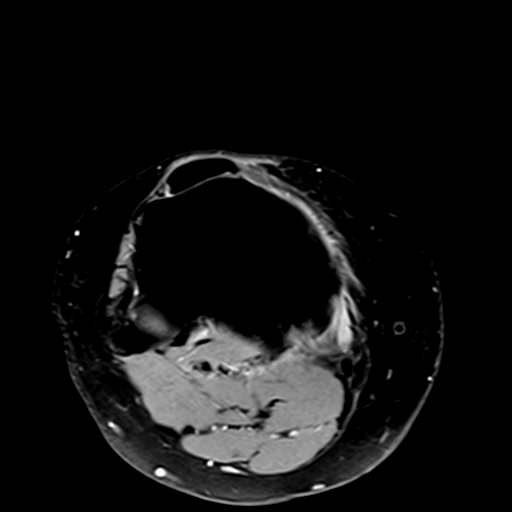
[im 9/27]
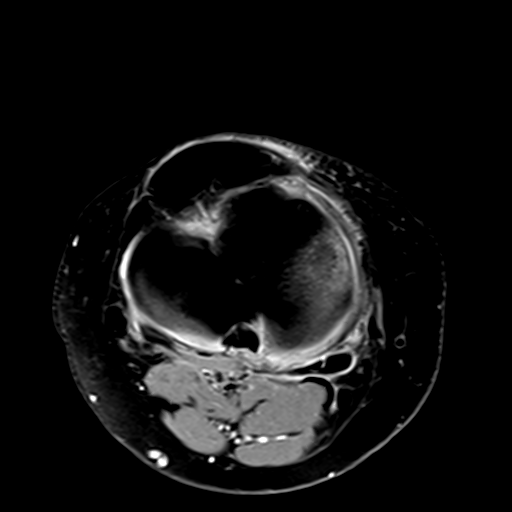
[im 14/27]
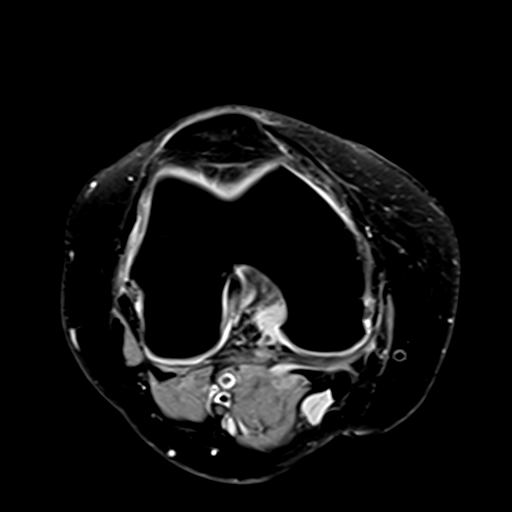
[im 18/27]
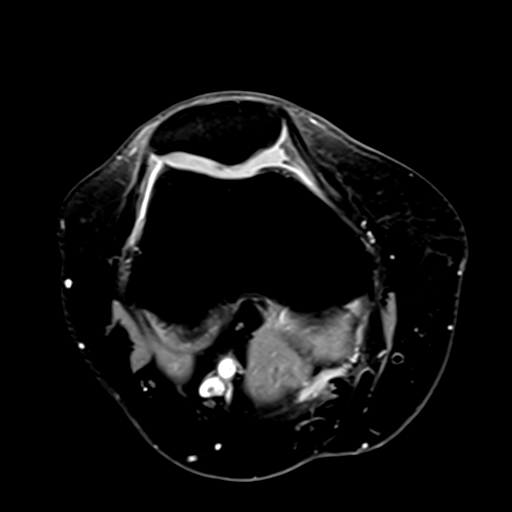
[im 22/27]
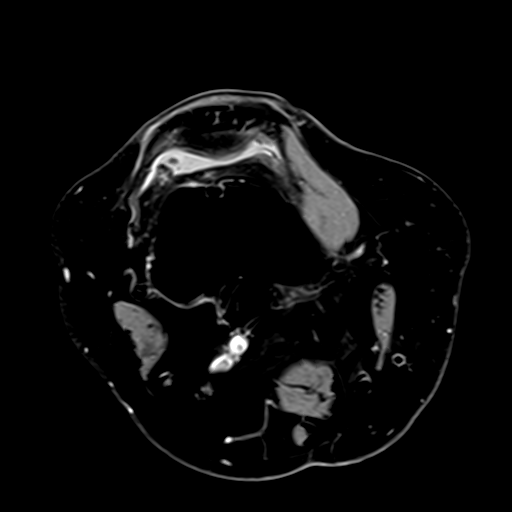
[im 27/27]
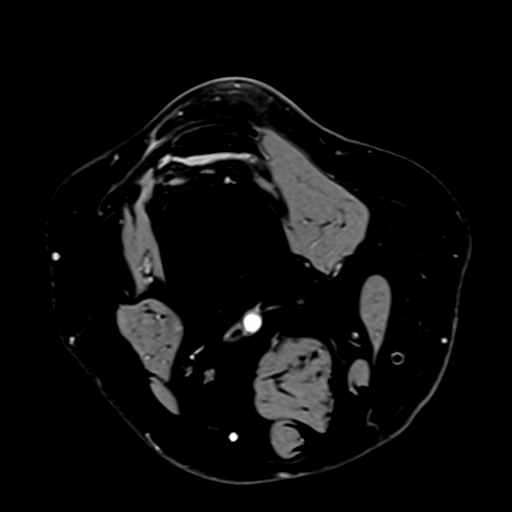

[Series 6: PD fat-sat · coronal · 3.5mm · 0.31mm/px · 6 of 24 slices shown (2 of 4)]
[im 1/24]
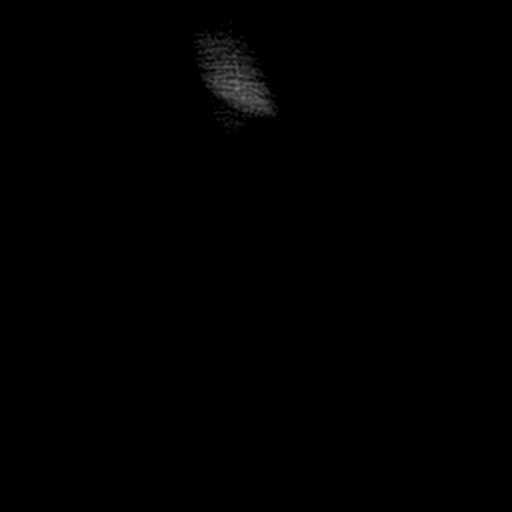
[im 4/24]
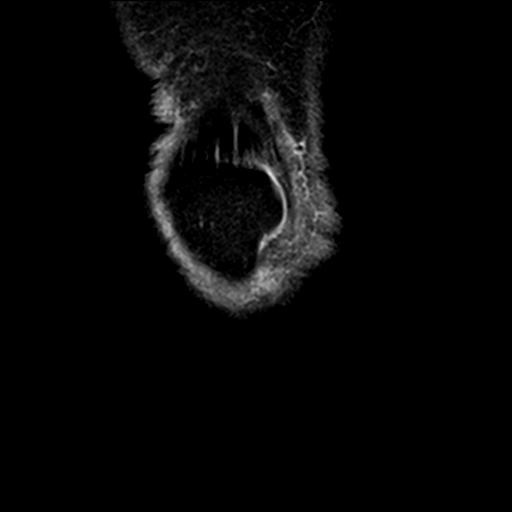
[im 8/24]
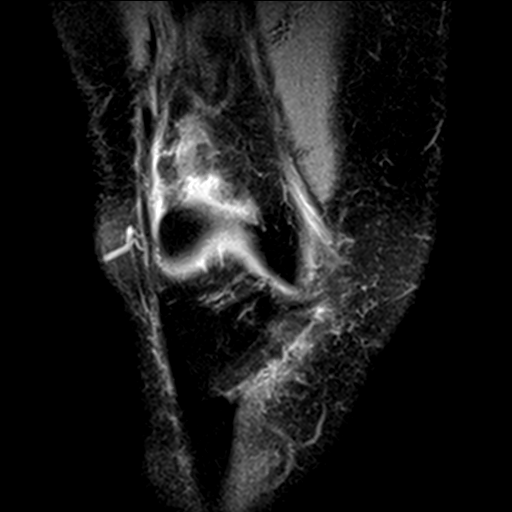
[im 12/24]
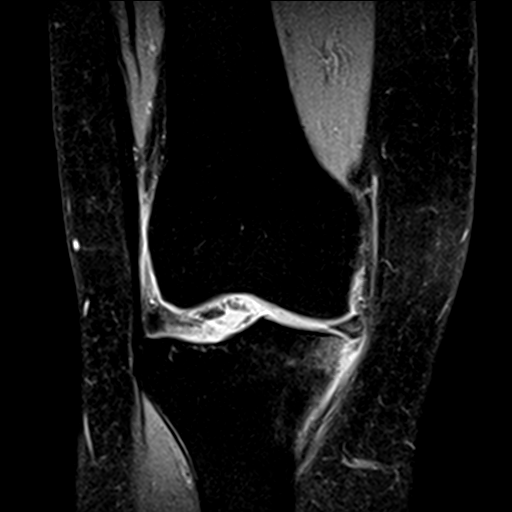
[im 16/24]
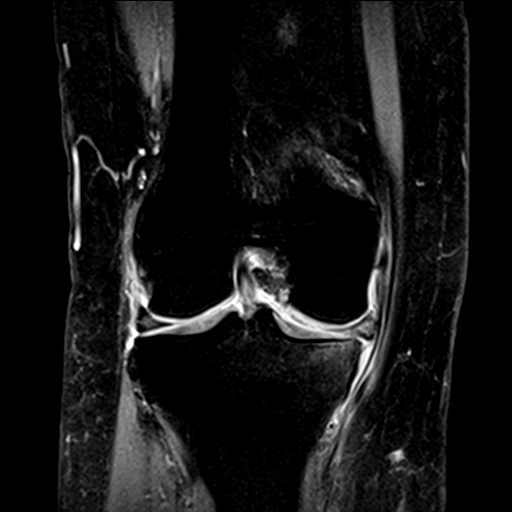
[im 20/24]
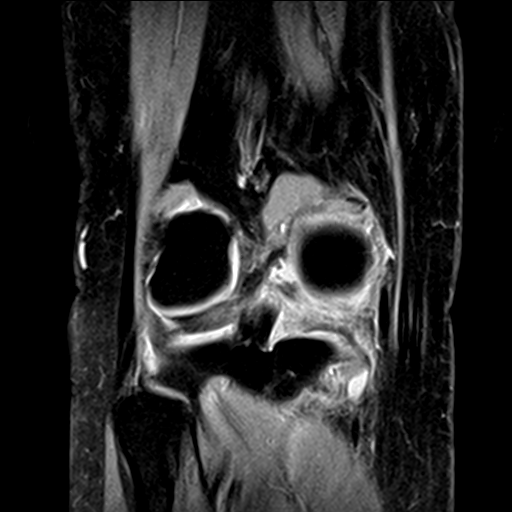

[Series 7: PD fat-sat · sagittal · 3.5mm · 0.31mm/px · 3 of 25 slices shown (3 of 4)]
[im 5/25]
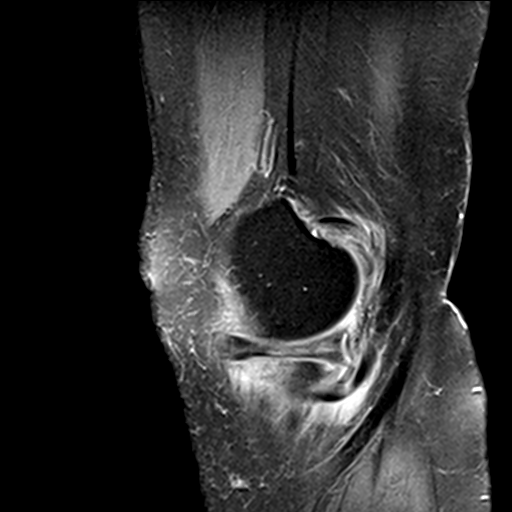
[im 13/25]
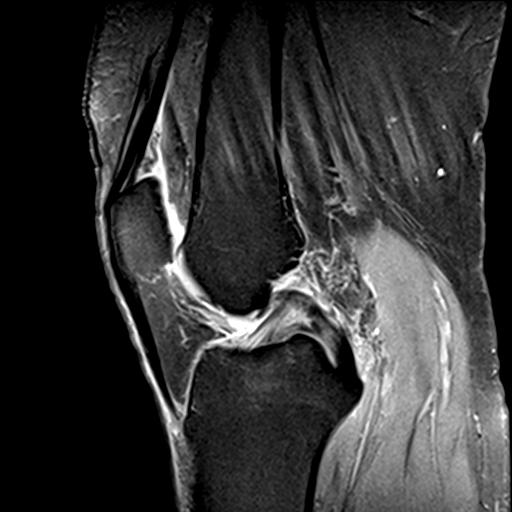
[im 21/25]
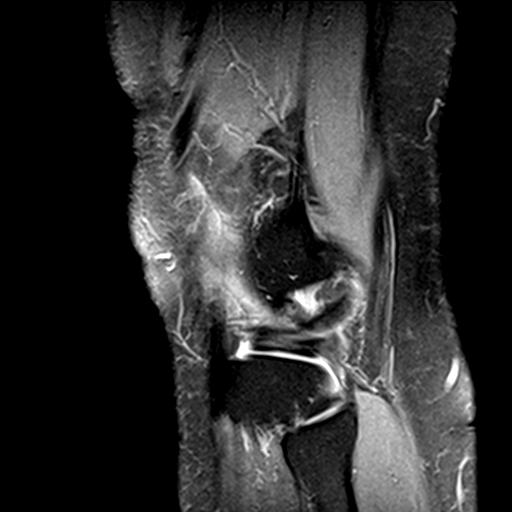

[Series 8: PD fat-sat · coronal · 2.0mm · 0.29mm/px · 3 of 17 slices shown (4 of 4)]
[im 1/17]
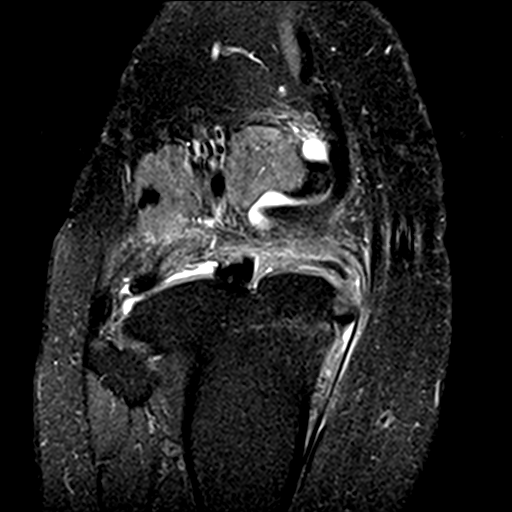
[im 9/17]
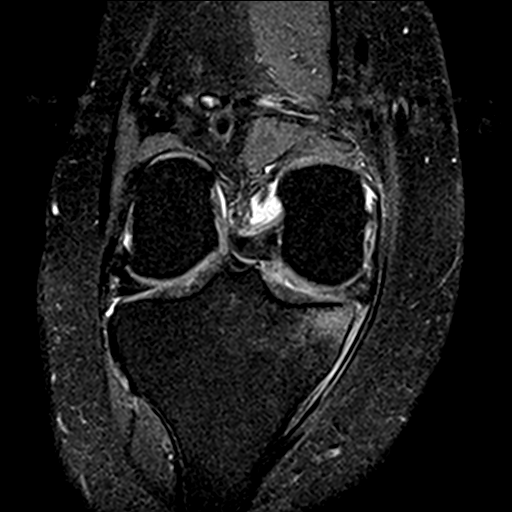
[im 17/17]
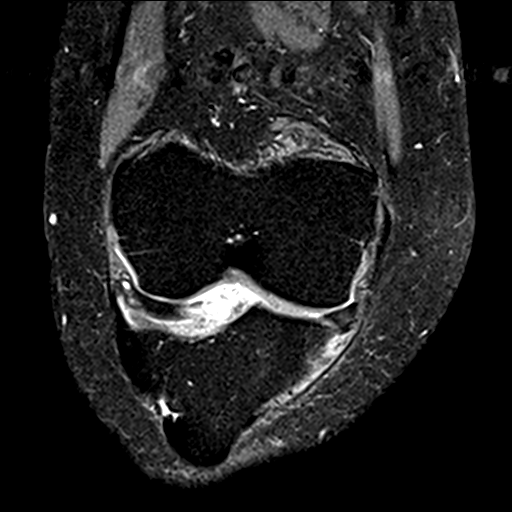

[19 of 40 positions shown; findings below may reference images not displayed]

FINDINGS: MENISCI

Medial meniscus: Extensive complex tearing is seen in the posterior
horn of the medial meniscus. There is a horizontal tear in the
posterior to mid body reaching the meniscal undersurface.
Degenerative signal in the anterior horn without tear is also
identified.

Lateral meniscus: Intrasubstance degenerative signal is most notable
in the body. No tear.

LIGAMENTS

Cruciates:  Intact.

Collaterals:  Intact.

CARTILAGE

Patellofemoral: Cartilage thinning is most notable at the apex and
along the medial facet in the mid and upper poles.

Medial: There is some fibrillation and irregularity of cartilage
surfaces. No full-thickness defect.

Lateral:  Mildly degenerated.

Joint:  No effusion.

Popliteal Fossa: Small Baker's cyst is approximately 1 cm transverse
by 0.8 cm AP by 3.8 cm craniocaudal.

Extensor Mechanism:  Intact.

Bones: The patient has a very small nondisplaced subchondral
fracture of the medial tibial plateau with associated marrow edema.
No displaced fracture or worrisome lesion.

Other: None.
IMPRESSION: Extensive complex tearing posterior horn medial meniscus. Horizontal
tear in the posterior to mid body of the medial meniscus reaches the
meniscal undersurface.

Small, nondisplaced subchondral fracture medial tibial plateau with
associated marrow edema.

Moderate osteoarthritis about the knee.

Small Baker's cyst.

## 2018-02-22 ENCOUNTER — Ambulatory Visit (INDEPENDENT_AMBULATORY_CARE_PROVIDER_SITE_OTHER): Payer: Medicare Other | Admitting: Internal Medicine

## 2018-02-22 DIAGNOSIS — Z23 Encounter for immunization: Secondary | ICD-10-CM | POA: Diagnosis not present

## 2018-02-22 NOTE — Patient Instructions (Signed)
Flu vaccine given by [facility]

## 2018-02-22 NOTE — Progress Notes (Signed)
Flu vaccine given.

## 2018-03-09 DIAGNOSIS — H43813 Vitreous degeneration, bilateral: Secondary | ICD-10-CM | POA: Diagnosis not present

## 2018-03-09 DIAGNOSIS — H26493 Other secondary cataract, bilateral: Secondary | ICD-10-CM | POA: Diagnosis not present

## 2018-03-09 DIAGNOSIS — H02825 Cysts of left lower eyelid: Secondary | ICD-10-CM | POA: Diagnosis not present

## 2018-03-11 ENCOUNTER — Other Ambulatory Visit: Payer: Self-pay | Admitting: Internal Medicine

## 2018-06-21 ENCOUNTER — Other Ambulatory Visit: Payer: Medicare Other | Admitting: Internal Medicine

## 2018-06-21 DIAGNOSIS — Z0000 Encounter for general adult medical examination without abnormal findings: Secondary | ICD-10-CM | POA: Diagnosis not present

## 2018-06-21 DIAGNOSIS — F411 Generalized anxiety disorder: Secondary | ICD-10-CM

## 2018-06-21 DIAGNOSIS — H905 Unspecified sensorineural hearing loss: Secondary | ICD-10-CM

## 2018-06-21 DIAGNOSIS — Z8739 Personal history of other diseases of the musculoskeletal system and connective tissue: Secondary | ICD-10-CM | POA: Diagnosis not present

## 2018-06-21 DIAGNOSIS — K589 Irritable bowel syndrome without diarrhea: Secondary | ICD-10-CM | POA: Diagnosis not present

## 2018-06-21 DIAGNOSIS — I071 Rheumatic tricuspid insufficiency: Secondary | ICD-10-CM | POA: Diagnosis not present

## 2018-06-21 DIAGNOSIS — E7800 Pure hypercholesterolemia, unspecified: Secondary | ICD-10-CM

## 2018-06-21 DIAGNOSIS — I10 Essential (primary) hypertension: Secondary | ICD-10-CM

## 2018-06-21 DIAGNOSIS — F439 Reaction to severe stress, unspecified: Secondary | ICD-10-CM

## 2018-06-22 LAB — CBC WITH DIFFERENTIAL/PLATELET
ABSOLUTE MONOCYTES: 631 {cells}/uL (ref 200–950)
BASOS PCT: 0.5 %
Basophils Absolute: 33 cells/uL (ref 0–200)
Eosinophils Absolute: 150 cells/uL (ref 15–500)
Eosinophils Relative: 2.3 %
HEMATOCRIT: 41.3 % (ref 35.0–45.0)
HEMOGLOBIN: 14 g/dL (ref 11.7–15.5)
LYMPHS ABS: 2620 {cells}/uL (ref 850–3900)
MCH: 31.9 pg (ref 27.0–33.0)
MCHC: 33.9 g/dL (ref 32.0–36.0)
MCV: 94.1 fL (ref 80.0–100.0)
MPV: 11 fL (ref 7.5–12.5)
Monocytes Relative: 9.7 %
NEUTROS ABS: 3068 {cells}/uL (ref 1500–7800)
NEUTROS PCT: 47.2 %
Platelets: 250 10*3/uL (ref 140–400)
RBC: 4.39 10*6/uL (ref 3.80–5.10)
RDW: 12.2 % (ref 11.0–15.0)
Total Lymphocyte: 40.3 %
WBC: 6.5 10*3/uL (ref 3.8–10.8)

## 2018-06-22 LAB — COMPLETE METABOLIC PANEL WITH GFR
AG Ratio: 1.7 (calc) (ref 1.0–2.5)
ALBUMIN MSPROF: 4.3 g/dL (ref 3.6–5.1)
ALKALINE PHOSPHATASE (APISO): 65 U/L (ref 37–153)
ALT: 10 U/L (ref 6–29)
AST: 17 U/L (ref 10–35)
BILIRUBIN TOTAL: 0.4 mg/dL (ref 0.2–1.2)
BUN / CREAT RATIO: 28 (calc) — AB (ref 6–22)
BUN: 27 mg/dL — ABNORMAL HIGH (ref 7–25)
CO2: 25 mmol/L (ref 20–32)
CREATININE: 0.96 mg/dL — AB (ref 0.60–0.93)
Calcium: 9.5 mg/dL (ref 8.6–10.4)
Chloride: 104 mmol/L (ref 98–110)
GFR, EST AFRICAN AMERICAN: 67 mL/min/{1.73_m2} (ref >=60)
GFR, Est Non African American: 58 mL/min/{1.73_m2} — ABNORMAL LOW (ref >=60)
GLOBULIN: 2.6 g/dL (ref 1.9–3.7)
Glucose, Bld: 94 mg/dL (ref 65–99)
Potassium: 4.4 mmol/L (ref 3.5–5.3)
SODIUM: 138 mmol/L (ref 135–146)
TOTAL PROTEIN: 6.9 g/dL (ref 6.1–8.1)

## 2018-06-22 LAB — LIPID PANEL
CHOL/HDL RATIO: 2.5 (calc) (ref <5.0)
CHOLESTEROL: 190 mg/dL (ref <200)
HDL: 77 mg/dL (ref >=50)
LDL Cholesterol (Calc): 95 mg/dL (calc)
NON-HDL CHOLESTEROL (CALC): 113 mg/dL (ref <130)
Triglycerides: 88 mg/dL (ref <150)

## 2018-06-22 LAB — TSH: TSH: 1.35 m[IU]/L (ref 0.40–4.50)

## 2018-06-24 ENCOUNTER — Ambulatory Visit (INDEPENDENT_AMBULATORY_CARE_PROVIDER_SITE_OTHER): Payer: Medicare Other | Admitting: Internal Medicine

## 2018-06-24 ENCOUNTER — Encounter: Payer: Self-pay | Admitting: Internal Medicine

## 2018-06-24 VITALS — BP 110/70 | HR 88 | Ht 64.0 in | Wt 150.0 lb

## 2018-06-24 DIAGNOSIS — K580 Irritable bowel syndrome with diarrhea: Secondary | ICD-10-CM | POA: Diagnosis not present

## 2018-06-24 DIAGNOSIS — M8580 Other specified disorders of bone density and structure, unspecified site: Secondary | ICD-10-CM | POA: Diagnosis not present

## 2018-06-24 DIAGNOSIS — I10 Essential (primary) hypertension: Secondary | ICD-10-CM | POA: Diagnosis not present

## 2018-06-24 DIAGNOSIS — Z0000 Encounter for general adult medical examination without abnormal findings: Secondary | ICD-10-CM

## 2018-06-24 DIAGNOSIS — H903 Sensorineural hearing loss, bilateral: Secondary | ICD-10-CM

## 2018-06-24 DIAGNOSIS — R8290 Unspecified abnormal findings in urine: Secondary | ICD-10-CM | POA: Diagnosis not present

## 2018-06-24 DIAGNOSIS — Z8739 Personal history of other diseases of the musculoskeletal system and connective tissue: Secondary | ICD-10-CM | POA: Diagnosis not present

## 2018-06-24 DIAGNOSIS — K589 Irritable bowel syndrome without diarrhea: Secondary | ICD-10-CM | POA: Diagnosis not present

## 2018-06-24 DIAGNOSIS — I071 Rheumatic tricuspid insufficiency: Secondary | ICD-10-CM

## 2018-06-24 DIAGNOSIS — Z87898 Personal history of other specified conditions: Secondary | ICD-10-CM | POA: Diagnosis not present

## 2018-06-24 LAB — POCT URINALYSIS DIPSTICK
Bilirubin, UA: NEGATIVE
GLUCOSE UA: NEGATIVE
Ketones, UA: NEGATIVE
Nitrite, UA: NEGATIVE
PH UA: 6 (ref 5.0–8.0)
Protein, UA: POSITIVE — AB
RBC UA: NEGATIVE
Spec Grav, UA: 1.01 (ref 1.010–1.025)
UROBILINOGEN UA: 0.2 U/dL

## 2018-06-24 MED ORDER — LISINOPRIL 5 MG PO TABS: 5.0000 mg | ORAL_TABLET | Freq: Every day | ORAL | 3 refills | Status: DC

## 2018-06-24 MED ORDER — ESZOPICLONE 2 MG PO TABS: ORAL_TABLET | ORAL | 2 refills | Status: DC

## 2018-06-24 MED ORDER — HYOSCYAMINE SULFATE 0.125 MG PO TABS: ORAL_TABLET | ORAL | 2 refills | Status: DC

## 2018-06-24 NOTE — Progress Notes (Signed)
Subjective:    Patient ID: Amber Garrett, female    DOB: Oct 31, 1940, 78 y.o.   MRN: [id number]  HPI pleasant 78 year old female in today for health maintenance exam and evaluation of medical issues.  Her general health is good and she continues to travel.  Her husband lives at [place].  He has multiple sclerosis and dementia.  She visits him daily.  History of irritable bowel symptoms that resolved with as needed Levsin.  She takes Lunesta to sleep.  Longstanding history of insomnia.  She takes lisinopril 5 mg daily for hypertension.  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  History of sciatica and osteopenia.  Past medical history: Possible allergy to Macrodantin because it causes rash on arms only.  History of C-section and incisional hernia with appendectomy 1973.  C-section in [place] in 1970.  Colonoscopy done in 2007 by Dr. Cristina Gong showing only diverticulosis.  She saw Dr. Micheline Chapman January 2019 for right for right knee torn posterior horn of medial meniscus and subchondral fracture of the medial tibial plateau MRI.  Knee was injected.  No known trauma to cause this.  She had an echocardiogram in 2006 to evaluate cardiac murmur.  She had moderate tricuspid regurgitation and elevation of right ventricular systolic pressure 30 to 40 mm consistent with mild pulmonary hypertension.  Mitral valve leaflets were thickened but opened well without stenosis.  Trace mitral regurgitation.  Bone density study January 2012 showed osteopenia.  Had cataract extractions by Dr. Bing Plume August 2014 and October 2014.  Social history: She has a Gaffer.  Does not smoke.  Social alcohol consumption.  She exercises regularly and enjoys traveling.  Her husband is a retired Forensic psychologist residing at [place].  Family history: Father died at age 26 with pulmonary fibrosis.  Mother died with  history of hypertension, heart issues, bradycardia, C. difficile and MRSA.  One brother in good health.  A son and daughter in good health.    Review of Systems  Constitutional: Negative.   Respiratory: Negative.   Cardiovascular: Negative.   Gastrointestinal: Negative.        Occasional irritable bowel symptoms  Genitourinary: Negative.   Neurological: Negative.        Objective:   Physical Exam Vitals signs reviewed.  Constitutional:      General: She is not in acute distress.    Appearance: Normal appearance. She is not ill-appearing.  HENT:     Head: Normocephalic and atraumatic.     Right Ear: Tympanic membrane normal.     Left Ear: Tympanic membrane normal.     Nose: Nose normal.     Mouth/Throat:     Mouth: Mucous membranes are moist.     Pharynx: Oropharynx is clear.  Eyes:     General: No scleral icterus.       Right eye: No discharge.        Left eye: No discharge.     Conjunctiva/sclera: Conjunctivae normal.     Pupils: Pupils are equal, round, and reactive to light.  Neck:     Musculoskeletal: Neck supple. No neck rigidity.     Comments: No thyromegaly Cardiovascular:     Rate and Rhythm: Normal rate and regular rhythm.     Pulses: Normal pulses.     Heart sounds: Normal heart sounds.     Comments: Breast without masses Pulmonary:     Effort: Pulmonary effort is normal.  No respiratory distress.     Breath sounds: Normal breath sounds. No wheezing.  Abdominal:     General: Bowel sounds are normal. There is no distension.     Palpations: Abdomen is soft.     Tenderness: There is no abdominal tenderness. There is no guarding.  Genitourinary:    Comments: Pap deferred due to age.  Bimanual normal. Musculoskeletal:     Right lower leg: No edema.     Left lower leg: No edema.  Skin:    General: Skin is warm and dry.  Neurological:     General: No focal deficit present.     Mental Status: She is alert and oriented to person, place, and time.      Cranial Nerves: No cranial nerve deficit.     Coordination: Coordination normal.  Psychiatric:        Mood and Affect: Mood normal.        Behavior: Behavior normal.        Thought Content: Thought content normal.        Judgment: Judgment normal.           Assessment & Plan:  Mild essential hypertension stable on low-dose lisinopril  Osteopenia reminded to take vitamin D.  Last DEXA scan 2017 lowest T score -2.4  History of irritable bowel syndrome treated with Levsin  History of moderate tricuspid regurgitation and mild pulmonary hypertension diagnosed in 2006 but asymptomatic  History of anxiety  Insomnia treated with Lunesta  Hearing loss- wears hearing aids  History of positive PPD with negative chest x-ray  Osteoarthritis  History of allergic rhinitis  History of sciatica  Urine dipstick is small LE but culture was negative  Mild tingling in and foot likely due to mild peripheral neuropathy or sciatica.  She is okay living with it  Plan: Doing well and can return in 1 year or as needed.  Refill Levsin.  Subjective:   Patient presents for Medicare Annual/Subsequent preventive examination.  Review Past Medical/Family/Social: See above   Risk Factors  Current exercise habits: Walks in light exercise Dietary issues discussed: Low-fat low carbohydrate  Cardiac risk factors: Family history in mother  Depression Screen  (Note: if answer to either of the following is "Yes", a more complete depression screening is indicated)   Over the past two weeks, have you felt down, depressed or hopeless? No  Over the past two weeks, have you felt little interest or pleasure in doing things? No Have you lost interest or pleasure in daily life? No Do you often feel hopeless? No Do you cry easily over simple problems? No   Activities of Daily Living  In your present state of health, do you have any difficulty performing the following activities?:   Driving? No    Managing money? No  Feeding yourself? No  Getting from bed to chair? No  Climbing a flight of stairs? No  Preparing food and eating?: No  Bathing or showering? No  Getting dressed: No  Getting to the toilet? No  Using the toilet:No  Moving around from place to place: No  In the past year have you fallen or had a near fall?:No  Are you sexually active? No  Do you have more than one partner? No   Hearing Difficulties: No  Do you often ask people to speak up or repeat themselves? No  Do you experience ringing or noises in your ears? No  Do you have difficulty understanding soft or whispered voices?  Yes due to hearing loss-wears hearing aids Do you feel that you have a problem with memory? No Do you often misplace items? No    Home Safety:  Do you have a smoke alarm at your residence? Yes Do you have grab bars in the bathroom?  None Do you have throw rugs in your house?  No   Cognitive Testing  Alert? Yes Normal Appearance?Yes  Oriented to person? Yes Place? Yes  Time? Yes  Recall of three objects? Yes  Can perform simple calculations? Yes  Displays appropriate judgment?Yes  Can read the correct time from a watch face?Yes   List the Names of Other Physician/Practitioners you currently use:  See referral list for the physicians patient is currently seeing.     Review of Systems: See above   Objective:     General appearance: Appears younger than stated age Head: Normocephalic, without obvious abnormality, atraumatic  Eyes: conj clear, EOMi PEERLA  Ears: normal TM's and external ear canals both ears  Nose: Nares normal. Septum midline. Mucosa normal. No drainage or sinus tenderness.  Throat: lips, mucosa, and tongue normal; teeth and gums normal  Neck: no adenopathy, no carotid bruit, no JVD, supple, symmetrical, trachea midline and thyroid not enlarged, symmetric, no tenderness/mass/nodules  No CVA tenderness.  Lungs: clear to auscultation bilaterally  Breasts:  normal appearance, no masses or tenderness Heart: regular rate and rhythm, S1, S2 normal Abdomen: soft, non-tender; bowel sounds normal; no masses, no organomegaly  Musculoskeletal: ROM normal in all joints, no crepitus, no deformity, Normal muscle strengthen. Back  is symmetric, no curvature. Skin: Skin color, texture, turgor normal. No rashes or lesions  Lymph nodes: Cervical, supraclavicular, and axillary nodes normal.  Neurologic: CN 2 -12 Normal, Normal symmetric reflexes. Normal coordination and gait  Psych: Alert & Oriented x 3, Mood appear stable.    Assessment:    Annual wellness medicare exam   Plan:    During the course of the visit the patient was educated and counseled about appropriate screening and preventive services including:   Annual mammogram  Annual flu vaccine     Patient Instructions (the written plan) was given to the patient.  Medicare Attestation  I have personally reviewed:  The patient's medical and social history  Their use of alcohol, tobacco or illicit drugs  Their current medications and supplements  The patient's functional ability including ADLs,fall risks, home safety risks, cognitive, and hearing and visual impairment  Diet and physical activities  Evidence for depression or mood disorders  The patient's weight, height, BMI, and visual acuity have been recorded in the chart. I have made referrals, counseling, and provided education to the patient based on review of the above and I have provided the patient with a written personalized care plan for preventive services.

## 2018-06-24 NOTE — Patient Instructions (Signed)
It was a pleasure to see you today.  Continue same medications and follow-up in 1 year or as needed.

## 2018-06-25 LAB — URINE CULTURE
MICRO NUMBER: 221052
SPECIMEN QUALITY: ADEQUATE

## 2018-07-03 ENCOUNTER — Encounter: Payer: Self-pay | Admitting: Internal Medicine

## 2018-10-01 ENCOUNTER — Other Ambulatory Visit: Payer: Self-pay | Admitting: Internal Medicine

## 2018-10-01 NOTE — Telephone Encounter (Signed)
Patient stated she had a physical in February 2020 and she will call back around December to book CPE for next year.

## 2018-10-01 NOTE — Telephone Encounter (Signed)
Past due for CPE please call before refilling

## 2018-10-21 DIAGNOSIS — L570 Actinic keratosis: Secondary | ICD-10-CM | POA: Diagnosis not present

## 2018-10-21 DIAGNOSIS — D225 Melanocytic nevi of trunk: Secondary | ICD-10-CM | POA: Diagnosis not present

## 2018-10-21 DIAGNOSIS — L821 Other seborrheic keratosis: Secondary | ICD-10-CM | POA: Diagnosis not present

## 2018-10-21 DIAGNOSIS — L814 Other melanin hyperpigmentation: Secondary | ICD-10-CM | POA: Diagnosis not present

## 2018-10-21 DIAGNOSIS — R208 Other disturbances of skin sensation: Secondary | ICD-10-CM | POA: Diagnosis not present

## 2018-10-21 DIAGNOSIS — B351 Tinea unguium: Secondary | ICD-10-CM | POA: Diagnosis not present

## 2018-11-15 ENCOUNTER — Encounter: Payer: Self-pay | Admitting: Internal Medicine

## 2018-11-15 DIAGNOSIS — Z1231 Encounter for screening mammogram for malignant neoplasm of breast: Secondary | ICD-10-CM | POA: Diagnosis not present

## 2018-12-06 ENCOUNTER — Telehealth: Payer: Self-pay | Admitting: Internal Medicine

## 2018-12-06 NOTE — Telephone Encounter (Signed)
Called and let patient know what Dr Baxley said and she verbalized understanding. 

## 2018-12-06 NOTE — Telephone Encounter (Signed)
Just monitor her temp and symptoms This was not a direct exposure.

## 2018-12-06 NOTE — Telephone Encounter (Signed)
Amber Garrett [id number]  Amber Garrett called to say that she went to [place] on Friday to see her grandchildren and one of them had played on Monday and Tuesday with another child that has now tested positive along with her parents and siblings. So now she is wandering if she should self quarantine herself, her granddaughter has showed no signs as of yet.

## 2019-01-15 DIAGNOSIS — Z23 Encounter for immunization: Secondary | ICD-10-CM | POA: Diagnosis not present

## 2019-02-14 ENCOUNTER — Other Ambulatory Visit: Payer: Self-pay | Admitting: Internal Medicine

## 2019-02-14 NOTE — Telephone Encounter (Signed)
Please make appt for CPE Feb before refilling

## 2019-03-15 DIAGNOSIS — H43813 Vitreous degeneration, bilateral: Secondary | ICD-10-CM | POA: Diagnosis not present

## 2019-03-15 DIAGNOSIS — H524 Presbyopia: Secondary | ICD-10-CM | POA: Diagnosis not present

## 2019-03-15 DIAGNOSIS — H26493 Other secondary cataract, bilateral: Secondary | ICD-10-CM | POA: Diagnosis not present

## 2019-03-15 DIAGNOSIS — H5213 Myopia, bilateral: Secondary | ICD-10-CM | POA: Diagnosis not present

## 2019-03-15 DIAGNOSIS — H52223 Regular astigmatism, bilateral: Secondary | ICD-10-CM | POA: Diagnosis not present

## 2019-05-16 ENCOUNTER — Other Ambulatory Visit: Payer: Self-pay | Admitting: Internal Medicine

## 2019-05-30 ENCOUNTER — Ambulatory Visit: Payer: Medicare Other

## 2019-06-27 ENCOUNTER — Other Ambulatory Visit: Payer: Medicare Other | Admitting: Internal Medicine

## 2019-06-30 ENCOUNTER — Encounter: Payer: Medicare Other | Admitting: Internal Medicine

## 2019-07-11 ENCOUNTER — Other Ambulatory Visit: Payer: Self-pay | Admitting: Internal Medicine

## 2019-07-11 DIAGNOSIS — I10 Essential (primary) hypertension: Secondary | ICD-10-CM

## 2019-08-02 ENCOUNTER — Other Ambulatory Visit: Payer: Self-pay

## 2019-08-02 ENCOUNTER — Other Ambulatory Visit: Payer: Medicare Other | Admitting: Internal Medicine

## 2019-08-02 DIAGNOSIS — Z1329 Encounter for screening for other suspected endocrine disorder: Secondary | ICD-10-CM

## 2019-08-02 DIAGNOSIS — K589 Irritable bowel syndrome without diarrhea: Secondary | ICD-10-CM | POA: Diagnosis not present

## 2019-08-02 DIAGNOSIS — Z13220 Encounter for screening for lipoid disorders: Secondary | ICD-10-CM | POA: Diagnosis not present

## 2019-08-02 DIAGNOSIS — G4700 Insomnia, unspecified: Secondary | ICD-10-CM | POA: Diagnosis not present

## 2019-08-02 DIAGNOSIS — I10 Essential (primary) hypertension: Secondary | ICD-10-CM | POA: Diagnosis not present

## 2019-08-02 DIAGNOSIS — Z0000 Encounter for general adult medical examination without abnormal findings: Secondary | ICD-10-CM | POA: Diagnosis not present

## 2019-08-02 DIAGNOSIS — M8580 Other specified disorders of bone density and structure, unspecified site: Secondary | ICD-10-CM | POA: Diagnosis not present

## 2019-08-02 LAB — CBC WITH DIFFERENTIAL/PLATELET
Absolute Monocytes: 706 cells/uL (ref 200–950)
Basophils Absolute: 50 cells/uL (ref 0–200)
Basophils Relative: 0.7 %
Eosinophils Absolute: 230 cells/uL (ref 15–500)
Eosinophils Relative: 3.2 %
HCT: 43.2 % (ref 35.0–45.0)
Hemoglobin: 14.6 g/dL (ref 11.7–15.5)
Lymphs Abs: 2909 cells/uL (ref 850–3900)
MCH: 32.2 pg (ref 27.0–33.0)
MCHC: 33.8 g/dL (ref 32.0–36.0)
MCV: 95.4 fL (ref 80.0–100.0)
MPV: 11.4 fL (ref 7.5–12.5)
Monocytes Relative: 9.8 %
Neutro Abs: 3305 cells/uL (ref 1500–7800)
Neutrophils Relative %: 45.9 %
Platelets: 260 10*3/uL (ref 140–400)
RBC: 4.53 10*6/uL (ref 3.80–5.10)
RDW: 12 % (ref 11.0–15.0)
Total Lymphocyte: 40.4 %
WBC: 7.2 10*3/uL (ref 3.8–10.8)

## 2019-08-02 LAB — COMPLETE METABOLIC PANEL WITH GFR
AG Ratio: 1.5 (calc) (ref 1.0–2.5)
ALT: 14 U/L (ref 6–29)
AST: 19 U/L (ref 10–35)
Albumin: 4.3 g/dL (ref 3.6–5.1)
Alkaline phosphatase (APISO): 62 U/L (ref 37–153)
BUN/Creatinine Ratio: 32 (calc) — ABNORMAL HIGH (ref 6–22)
BUN: 29 mg/dL — ABNORMAL HIGH (ref 7–25)
CO2: 26 mmol/L (ref 20–32)
Calcium: 9.5 mg/dL (ref 8.6–10.4)
Chloride: 102 mmol/L (ref 98–110)
Creat: 0.9 mg/dL (ref 0.60–0.93)
GFR, Est African American: 72 mL/min/{1.73_m2} (ref >=60)
GFR, Est Non African American: 62 mL/min/{1.73_m2} (ref >=60)
Globulin: 2.8 g/dL (calc) (ref 1.9–3.7)
Glucose, Bld: 96 mg/dL (ref 65–99)
Potassium: 4.4 mmol/L (ref 3.5–5.3)
Sodium: 137 mmol/L (ref 135–146)
Total Bilirubin: 0.6 mg/dL (ref 0.2–1.2)
Total Protein: 7.1 g/dL (ref 6.1–8.1)

## 2019-08-02 LAB — LIPID PANEL
Cholesterol: 219 mg/dL — ABNORMAL HIGH (ref <200)
HDL: 71 mg/dL (ref >=50)
LDL Cholesterol (Calc): 126 mg/dL (calc) — ABNORMAL HIGH
Non-HDL Cholesterol (Calc): 148 mg/dL (calc) — ABNORMAL HIGH (ref <130)
Total CHOL/HDL Ratio: 3.1 (calc) (ref <5.0)
Triglycerides: 108 mg/dL (ref <150)

## 2019-08-02 LAB — TSH: TSH: 1.95 mIU/L (ref 0.40–4.50)

## 2019-08-04 ENCOUNTER — Ambulatory Visit (INDEPENDENT_AMBULATORY_CARE_PROVIDER_SITE_OTHER): Payer: Medicare Other | Admitting: Internal Medicine

## 2019-08-04 ENCOUNTER — Other Ambulatory Visit: Payer: Self-pay

## 2019-08-04 ENCOUNTER — Encounter: Payer: Self-pay | Admitting: Internal Medicine

## 2019-08-04 VITALS — BP 130/88 | HR 90 | Temp 98.0°F | Ht 63.5 in | Wt 152.0 lb

## 2019-08-04 DIAGNOSIS — Z0000 Encounter for general adult medical examination without abnormal findings: Secondary | ICD-10-CM

## 2019-08-04 DIAGNOSIS — H903 Sensorineural hearing loss, bilateral: Secondary | ICD-10-CM

## 2019-08-04 DIAGNOSIS — G4700 Insomnia, unspecified: Secondary | ICD-10-CM

## 2019-08-04 DIAGNOSIS — I071 Rheumatic tricuspid insufficiency: Secondary | ICD-10-CM

## 2019-08-04 DIAGNOSIS — E7800 Pure hypercholesterolemia, unspecified: Secondary | ICD-10-CM

## 2019-08-04 DIAGNOSIS — Z87898 Personal history of other specified conditions: Secondary | ICD-10-CM

## 2019-08-04 DIAGNOSIS — I10 Essential (primary) hypertension: Secondary | ICD-10-CM

## 2019-08-04 DIAGNOSIS — K580 Irritable bowel syndrome with diarrhea: Secondary | ICD-10-CM

## 2019-08-04 DIAGNOSIS — M8580 Other specified disorders of bone density and structure, unspecified site: Secondary | ICD-10-CM

## 2019-08-04 LAB — POCT URINALYSIS DIPSTICK
Appearance: NEGATIVE
Bilirubin, UA: NEGATIVE
Bilirubin, UA: NEGATIVE
Blood, UA: NEGATIVE
Blood, UA: NEGATIVE
Glucose, UA: NEGATIVE
Glucose, UA: NEGATIVE
Ketones, UA: NEGATIVE
Ketones, UA: NEGATIVE
Leukocytes, UA: NEGATIVE
Nitrite, UA: NEGATIVE
Nitrite, UA: NEGATIVE
Odor: NEGATIVE
Protein, UA: NEGATIVE
Protein, UA: NEGATIVE
Spec Grav, UA: 1.015 (ref 1.010–1.025)
Spec Grav, UA: 1.015 (ref 1.010–1.025)
Urobilinogen, UA: 0.2 E.U./dL
Urobilinogen, UA: 0.2 E.U./dL
pH, UA: 6.5 (ref 5.0–8.0)
pH, UA: 6.5 (ref 5.0–8.0)

## 2019-08-04 MED ORDER — ZOLPIDEM TARTRATE 5 MG PO TABS: 5.0000 mg | ORAL_TABLET | Freq: Every evening | ORAL | 0 refills | Status: DC | PRN

## 2019-08-04 NOTE — Patient Instructions (Addendum)
It was a pleasure to see you today. Watch diet and continue to exercise. Watch BP at home and call if persistently elevated.  Trial of Ambien 5 mg at bedtime for insomnia.
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
## 2019-08-30 ENCOUNTER — Encounter: Payer: Self-pay | Admitting: Sports Medicine

## 2019-08-30 ENCOUNTER — Other Ambulatory Visit: Payer: Self-pay

## 2019-08-30 ENCOUNTER — Ambulatory Visit (INDEPENDENT_AMBULATORY_CARE_PROVIDER_SITE_OTHER): Payer: Medicare Other | Admitting: Sports Medicine

## 2019-08-30 VITALS — BP 106/80 | Ht 64.0 in | Wt 151.6 lb

## 2019-08-30 DIAGNOSIS — M5416 Radiculopathy, lumbar region: Secondary | ICD-10-CM | POA: Diagnosis present

## 2019-08-30 MED ORDER — METHYLPREDNISOLONE ACETATE 80 MG/ML IJ SUSP
80.0000 mg | Freq: Once | INTRAMUSCULAR | Status: AC
  Administered 2019-08-30: 80 mg via INTRAMUSCULAR

## 2019-08-30 MED ORDER — PREDNISONE 10 MG PO TABS: ORAL_TABLET | ORAL | 0 refills | Status: DC

## 2019-08-30 NOTE — Progress Notes (Signed)
   Subjective:    Patient ID: Amber Garrett, female    DOB: [birth year], 79 y.o.   MRN: [id number]  HPI chief complaint: Right leg pain  Very pleasant 79 year old female comes in today with returning right leg sciatica.  She was last seen in the office with a similar complaint in May 2018.  Symptoms resolved with an IM Depo-Medrol injection, a 6-day Sterapred Dosepak, and physical therapy.  She was doing well up until around December of last year.  At that time she began to have a return of right-sided low back pain and right leg radiculopathy.  Her pain is much worse with sitting, improves with standing and walking.  Pain radiates down the lateral aspect of the right lower leg and foot.  She also endorses some foot tingling.  She also states that the leg wants to give way from time to time.  She works with a Physiological scientist diligently to help strengthen her core.  She recently tried some naproxen sodium but did not notice it to be very beneficial.  An MRI of her lumbar spine in 2018 showed dextroscoliosis and a small right-sided foraminal disc protrusion at L4-L5.  Interim medical history reviewed Medications reviewed Allergies reviewed    Review of Systems    As above Objective:   Physical Exam  Well-developed, well-nourished.  No acute distress.  Awake alert and oriented x3.  Vital signs reviewed  Lumbar spine: Good lumbar range of motion.  No tenderness to palpation.  Right hip: Smooth painless hip range of motion with a negative logroll  Neurological exam: Strength is 5/5 in both lower extremities.  Reflexes are brisk and equal at the patellar and Achilles tendons.  Sensation is intact to light touch grossly.  MRI of the lumbar spine from 2018 is as above.      Assessment & Plan:   Returning right leg radiculopathy with MRI evidence of a small right-sided L4-L5 disc protrusion in 2018  We are going to repeat the same treatment that we did in 2018.  80 mg of Depo-Medrol IM  was injected today.  A prescription for a 6-day Sterapred Dosepak was supplied with instructions to start this tomorrow.  In lieu of formal physical therapy we will teach her some McKenzie extension exercises.  She will talk with her personal trainer and make sure that she is avoiding any sort of exercise that causes hip flexion or leaning forward from the waist.  If symptoms persist I would consider a return to formal physical therapy prior to updated imaging.  Follow-up for ongoing or recalcitrant issues.

## 2019-09-09 ENCOUNTER — Telehealth: Payer: Self-pay

## 2019-09-09 DIAGNOSIS — M5416 Radiculopathy, lumbar region: Secondary | ICD-10-CM

## 2019-09-09 NOTE — Telephone Encounter (Signed)
Per Dr. Micheline Chapman she can try physical therapy again, get an updated MRI of her lumbar spine to potentially get a lumbar ESI, or both. The pt would like to try both. She is open to a PT that Dr. Micheline Chapman recommends.   MRI order placed and PT referral to [facility] to work with Cheryln Manly.

## 2019-09-15 ENCOUNTER — Other Ambulatory Visit: Payer: Self-pay

## 2019-09-15 ENCOUNTER — Encounter: Payer: Self-pay | Admitting: Physical Therapy

## 2019-09-15 ENCOUNTER — Ambulatory Visit: Payer: Medicare Other | Attending: Sports Medicine | Admitting: Physical Therapy

## 2019-09-15 DIAGNOSIS — M25551 Pain in right hip: Secondary | ICD-10-CM | POA: Diagnosis not present

## 2019-09-15 DIAGNOSIS — R209 Unspecified disturbances of skin sensation: Secondary | ICD-10-CM | POA: Diagnosis not present

## 2019-09-15 DIAGNOSIS — M5416 Radiculopathy, lumbar region: Secondary | ICD-10-CM | POA: Insufficient documentation

## 2019-09-15 DIAGNOSIS — M6281 Muscle weakness (generalized): Secondary | ICD-10-CM | POA: Diagnosis not present

## 2019-09-15 NOTE — Patient Instructions (Signed)

## 2019-09-16 NOTE — Therapy (Signed)
[place], [place], [address] Phone: [phone]   Fax:  [phone]  Physical Therapy Evaluation  Patient Details  Name: Amber Garrett MRN: [id number] Date of Birth: 12-22-1940 Referring Provider (PT): Dr. Lilia Argue    Encounter Date: 09/15/2019  PT End of Session - 09/15/19 1757    Visit Number  1    Number of Visits  12    Date for PT Re-Evaluation  10/27/19    PT Start Time  1700    PT Stop Time  1756    PT Time Calculation (min)  56 min    Activity Tolerance  Patient tolerated treatment well    Behavior During Therapy  [facility] for tasks assessed/performed       Past Medical History:  Diagnosis Date  . HTN (hypertension)   . Hx: UTI (urinary tract infection)   . OA (osteoarthritis)   . Positive PPD   . Sciatica   . Vaginal atrophy     Past Surgical History:  Procedure Laterality Date  . APPENDECTOMY    . CESAREAN SECTION    . ENDOMETRIAL ABLATION    . INGUINAL HERNIA REPAIR      There were no vitals filed for this visit.   Subjective Assessment - 09/15/19 1709    Subjective  Patient with chronic back and sciatic N pain, this round began Dec. 2020, pain will dissapate and flare up periodically.  She endorses hip weakness.  R hip feel tight. She has had injection and prednisone with min benefit.  Jun 3 having MRI.  She has pain from Rt buttock to Rt foot. She walks 4-5 miles a day and that does aggravate her pain.    Limitations  Sitting    Patient Stated Goals  Patient would like to not have this pain    Currently in Pain?  Yes         [facility] PT Assessment - 09/16/19 0001      Assessment   Medical Diagnosis  Lumbar radiculopathy R     Referring Provider (PT)  Dr. Lilia Argue     Prior Therapy  Yes       Precautions   Precautions  None      Restrictions   Weight Bearing Restrictions  No      Balance Screen   Has the patient fallen in the past 6 months  No      [place] residence    Living Arrangements  Alone    Type of [place]  Two level;Able to live on main level with bedroom/bathroom      Prior Function   Level of Independence  Independent    Vocation  Retired    Production assistant, radio , Environmental manager , various     Leisure  recently widowed, sees Company secretary of friends, walking       Cognition   Overall Cognitive Status  Within Functional Limits for tasks assessed      Observation/Other Assessments   Focus on Therapeutic Outcomes (FOTO)   28%      Sensation   Light Touch  Impaired by gross assessment    Additional Comments  Rt lower leg and foot       Coordination   Gross Motor Movements are Fluid and Coordinated  Not tested      Functional Tests   Functional tests  Squat  Squat   Comments  good form no pain       Posture/Postural Control   Posture/Postural Control  No significant limitations      PROM   Overall PROM Comments  limited in Rt hip ER and IR > LLE      Strength   Right Hip Flexion  4/5    Right Hip ABduction  4-/5    Left Hip Flexion  5/5    Left Hip ABduction  4+/5    Right Knee Flexion  4-/5    Right Knee Extension  5/5    Left Knee Flexion  4/5    Left Knee Extension  5/5    Right Ankle Dorsiflexion  5/5    Left Ankle Dorsiflexion  5/5      Flexibility   Hamstrings  tighter on R but WFL     Piriformis  Rt piriformis tighter than L       Palpation   Spinal mobility  PA mobility mildly hypomobile     Palpation comment  Min pain with palpation to Rt posterior hip, tension throughout and > than  LLE       Bed Mobility   Bed Mobility  --   WNL     Transfers   Comments  Stands for majority of eval , no issues with transfers       Objective measurements completed on examination: See above findings.    PT Education - 09/16/19 0949    Education Details  PT/POC, HEP, anatomy, sciatica vs radiculopathy    Person(s) Educated  Patient     Methods  Explanation;Demonstration    Comprehension  Verbalized understanding;Returned demonstration          PT Long Term Goals - 09/16/19 0950      PT LONG TERM GOAL #1   Title  pt will be I with all HEP given as of last visit    Time  6    Period  Weeks    Status  New    Target Date  11/10/19      PT LONG TERM GOAL #2   Title  pt will increase R hip extensor/ abductor strength to >/= 4+/5 to promote safety with walking/ standing activities    Time  6    Period  Weeks    Status  New    Target Date  11/10/19      PT LONG TERM GOAL #3   Title  She will be able to sit for >/= 30 min with </= 1/10 pain in the hip and no report of tingling down the RLE (08/20/2016)    Baseline  unable to sit comfortably at all    Status  New    Target Date  11/10/19      PT LONG TERM GOAL #4   Title  pt will be able to push the vacuum, perform light housework without increase in pain    Time  6    Period  Weeks    Status  New    Target Date  11/10/19             Plan - 09/16/19 0956    Clinical Impression Statement  Patient with recurrence of Rt sided leg pain and symptoms consistent with lumbar radiculopathy vs sciatica, mod complexity eval due to worsening severity of symptoms. She is unable to sit any length of time comfortably.  She exhibits min pain in Rt L5 regios and tightness throughout Rt  hip, especially in piriformis.  She continues to work with a trainer 3 x per week and this does not aggravate her symptoms.  She shows a good hip hinge with squatting.  She has deficits in Rt LE strength and core strength as well.  PT will include self care strategies and ensure proper form with exercises, modalities for pain.    Personal Factors and Comorbidities  Past/Current Experience;Age;Comorbidity 1    Comorbidities  history of similar symptoms, HTN    Examination-Activity Limitations  Bed Mobility;Sit;Lift    Examination-Participation Restrictions  Community Activity;Cleaning     Stability/Clinical Decision Making  Stable/Uncomplicated    Clinical Decision Making  Low    Rehab Potential  Excellent    PT Frequency  2x / week    PT Duration  6 weeks   allow 8 weeks for scheduling   PT Treatment/Interventions  ADLs/Self Care Home Management;Electrical Stimulation;Cryotherapy;Iontophoresis 4mg /ml Dexamethasone;Moist Heat;Ultrasound;Therapeutic exercise;Dry needling;Passive range of motion;Patient/family education;Manual techniques;Therapeutic activities;Functional mobility training    PT Next Visit Plan  check HEP, manual/modalities to R piriforims , MRI results    PT Home Exercise Plan  prone press up,  multiple stretches for piriformis syndrome    Consulted and Agree with Plan of Care  Patient       Patient will benefit from skilled therapeutic intervention in order to improve the following deficits and impairments:  Decreased strength, Decreased range of motion, Decreased mobility, Impaired flexibility, Increased fascial restricitons, Pain  Visit Diagnosis: Pain in right hip  Sensory disturbance  Radiculopathy, lumbar region  Muscle weakness (generalized)     Problem List Patient Active Problem List   Diagnosis Date Noted  . Acute pain of right knee 03/30/2017  . Sciatica of right side 05/19/2016  . Irritable bowel syndrome 12/29/2015  . Osteopenia 12/14/2015  . Depression 08/25/2013  . Back pain 11/13/2011  . Allergic rhinitis 11/13/2011  . Hearing loss 04/03/2011  . HTN (hypertension) 10/31/2010  . Anxiety 10/31/2010  . Insomnia 10/31/2010    Aliani Caccavale 09/16/2019, 11:06 AM  [facility] [address] [place], [place], [address] Phone: [phone]   Fax:  [phone]  Name: Amber Garrett MRN: [id number] Date of Birth: [birth year]   Raeford Razor, PT 09/16/19 11:06 AM Phone: [phone] Fax: [phone]

## 2019-09-19 ENCOUNTER — Other Ambulatory Visit: Payer: Self-pay | Admitting: Internal Medicine

## 2019-09-20 ENCOUNTER — Other Ambulatory Visit: Payer: Self-pay

## 2019-09-20 ENCOUNTER — Ambulatory Visit: Payer: Medicare Other | Admitting: Physical Therapy

## 2019-09-20 ENCOUNTER — Encounter: Payer: Self-pay | Admitting: Physical Therapy

## 2019-09-20 DIAGNOSIS — R209 Unspecified disturbances of skin sensation: Secondary | ICD-10-CM

## 2019-09-20 DIAGNOSIS — M25551 Pain in right hip: Secondary | ICD-10-CM | POA: Diagnosis not present

## 2019-09-20 DIAGNOSIS — M5416 Radiculopathy, lumbar region: Secondary | ICD-10-CM | POA: Diagnosis not present

## 2019-09-20 DIAGNOSIS — M6281 Muscle weakness (generalized): Secondary | ICD-10-CM

## 2019-09-20 NOTE — Patient Instructions (Signed)
Prepared By: [place], [place]  Phone: [phone]  Supine Bridge with Resistance Band sets: 2 reps: 10 hold: 5 daily: 1  weekly: 7      Exercise image step 1   Exercise image step 2  Setup  Begin lying on your back with your arms laying at your sides, your legs bent at the knees and your feet flat on the ground, with a resistance band secured around your legs. Movement  Maintaining tension in the resistance band, tighten your abdominals and slowly lift your hips off the floor into a bridge position, keeping your back straight. Tip  Make sure to keep your trunk stiff throughout the exercise and your arms flat on the floor. Clamshell with Resistance sets: 2 reps: 10 hold: 5 daily: 1  weekly: 7      Exercise image step 1   Exercise image step 2  Setup  Begin by lying on your side with your knees bent 90 degrees, hips and shoulders stacked, and a resistance loop secured around your legs.  Movement  Raise your top knee away from the bottom one, then slowly return to the starting position.  Tip  Make sure not to roll your hips forward or backward during the exercise.

## 2019-09-20 NOTE — Therapy (Signed)
[place], [place], [address] Phone: [phone]   Fax:  [phone]  Physical Therapy Treatment  Patient Details  Name: Amber Garrett MRN: [id number] Date of Birth: [birth year] Referring Provider (PT): Dr. Lilia Argue    Encounter Date: 09/20/2019  PT End of Session - 09/20/19 1719    Visit Number  2    Number of Visits  12    Date for PT Re-Evaluation  10/27/19    PT Start Time  1620    PT Stop Time  1712    PT Time Calculation (min)  52 min    Activity Tolerance  Patient tolerated treatment well    Behavior During Therapy  [facility] for tasks assessed/performed       Past Medical History:  Diagnosis Date  . HTN (hypertension)   . Hx: UTI (urinary tract infection)   . OA (osteoarthritis)   . Positive PPD   . Sciatica   . Vaginal atrophy     Past Surgical History:  Procedure Laterality Date  . APPENDECTOMY    . CESAREAN SECTION    . ENDOMETRIAL ABLATION    . INGUINAL HERNIA REPAIR      There were no vitals filed for this visit.  Subjective Assessment - 09/20/19 1625    Subjective  I am much better.  I drive to [place] and had a little pain, had to stop 1 x but I was really pretty comfortable.    Currently in Pain?  Yes    Pain Score  2     Pain Location  Back    Pain Orientation  Lower;Left    Pain Descriptors / Indicators  Discomfort;Aching    Pain Type  Chronic pain    Pain Onset  More than a month ago    Pain Frequency  Intermittent    Aggravating Factors   sitting    Pain Relieving Factors  stretching         [facility] Adult PT Treatment/Exercise - 09/20/19 0001      Self-Care   Self-Care  Other Self-Care Comments    Other Self-Care Comments   core, manual and dry needling?       Lumbar Exercises: Supine   Clam Limitations  blue band x 10 alternating       Knee/Hip Exercises: Stretches   Active Hamstring Stretch  Both;3 reps;30 seconds    ITB Stretch  Both;2 reps;30  seconds    Piriformis Stretch  Both;3 reps;30 seconds    Piriformis Stretch Limitations  fallen figure 4 (rotation)       Knee/Hip Exercises: Supine   Bridges with Ball Squeeze  Strengthening;Both;1 set;10 reps    Bridges with Clamshell  Strengthening;Both;1 set;10 reps      Modalities   Modalities  Moist Heat      Moist Heat Therapy   Number Minutes Moist Heat  10 Minutes    Moist Heat Location  Lumbar Spine      Manual Therapy   Manual Therapy  Soft tissue mobilization;Passive ROM    Soft tissue mobilization  bilateral lumbar paraspinals, Rt quadratus lumborum     Passive ROM  Rt hip ER/IR with compression in prone           PT Education - 09/20/19 1718    Education Details  HEP and form, tightness in Rt lumbar, soft tissue work    [employer]) Educated  Patient    Methods  Explanation;Demonstration  Comprehension  Verbalized understanding;Returned demonstration          PT Long Term Goals - 09/20/19 1642      PT LONG TERM GOAL #1   Title  pt will be I with all HEP given as of last visit    Status  On-going      PT LONG TERM GOAL #2   Title  pt will increase R hip extensor/ abductor strength to >/= 4+/5 to promote safety with walking/ standing activities    Status  On-going      PT LONG TERM GOAL #3   Title  She will be able to sit for >/= 30 min with </= 1/10 pain in the hip and no report of tingling down the RLE (08/20/2016)    Status  On-going      PT LONG TERM GOAL #4   Title  pt will be able to push the vacuum, perform light housework without increase in pain    Status  On-going            Plan - 09/20/19 1719    Clinical Impression Statement  Patient with resolution of symptoms in Rt hip, mild increase in Rt low back pain (L3-L4, quadratus).  Able to sit for short periods without increased pain intermittently. Reviewed HEP and provided soft tissue work for release of pain and tension.  Cont POC, may decide not to get dry needling if she continue to  improve.    PT Treatment/Interventions  ADLs/Self Care Home Management;Electrical Stimulation;Cryotherapy;Iontophoresis 4mg /ml Dexamethasone;Moist Heat;Ultrasound;Therapeutic exercise;Dry needling;Passive range of motion;Patient/family education;Manual techniques;Therapeutic activities;Functional mobility training    PT Next Visit Plan  progress core, lower abs, bridge progression. manual/modalities to R piriforims ,Rt lumbar    PT Home Exercise Plan  prone press up,  multiple stretches for piriformis syndrome, SL clam and bridge with band    Consulted and Agree with Plan of Care  Patient       Patient will benefit from skilled therapeutic intervention in order to improve the following deficits and impairments:  Decreased strength, Decreased range of motion, Decreased mobility, Impaired flexibility, Increased fascial restricitons, Pain  Visit Diagnosis: Pain in right hip  Sensory disturbance  Radiculopathy, lumbar region  Muscle weakness (generalized)     Problem List Patient Active Problem List   Diagnosis Date Noted  . Acute pain of right knee 03/30/2017  . Sciatica of right side 05/19/2016  . Irritable bowel syndrome 12/29/2015  . Osteopenia 12/14/2015  . Depression 08/25/2013  . Back pain 11/13/2011  . Allergic rhinitis 11/13/2011  . Hearing loss 04/03/2011  . HTN (hypertension) 10/31/2010  . Anxiety 10/31/2010  . Insomnia 10/31/2010    Hyde Sires 09/20/2019, 5:26 PM  [facility] [address] [place], [place], [address] Phone: [phone]   Fax:  [phone]  Name: Amber Garrett MRN: [id number] Date of Birth: 19-Nov-1940  Raeford Razor, PT 09/20/19 5:26 PM Phone: [phone] Fax: [phone]

## 2019-10-05 ENCOUNTER — Encounter: Payer: Self-pay | Admitting: Physical Therapy

## 2019-10-05 ENCOUNTER — Other Ambulatory Visit: Payer: Self-pay

## 2019-10-05 ENCOUNTER — Ambulatory Visit: Payer: Medicare Other | Attending: Sports Medicine | Admitting: Physical Therapy

## 2019-10-05 DIAGNOSIS — M5416 Radiculopathy, lumbar region: Secondary | ICD-10-CM | POA: Diagnosis present

## 2019-10-05 DIAGNOSIS — R209 Unspecified disturbances of skin sensation: Secondary | ICD-10-CM | POA: Diagnosis present

## 2019-10-05 DIAGNOSIS — M6281 Muscle weakness (generalized): Secondary | ICD-10-CM | POA: Diagnosis present

## 2019-10-05 DIAGNOSIS — M25551 Pain in right hip: Secondary | ICD-10-CM | POA: Diagnosis present

## 2019-10-05 NOTE — Therapy (Signed)
[place], [place], [address] Phone: [phone]   Fax:  [phone]  Physical Therapy Treatment  Patient Details  Name: Amber Garrett MRN: [id number] Date of Birth: 05-30-1940 Referring Provider (PT): Dr. Lilia Argue    Encounter Date: 10/05/2019  PT End of Session - 10/05/19 1243    Visit Number  3    Number of Visits  12    Date for PT Re-Evaluation  10/27/19    PT Start Time  1147    PT Stop Time  1229    PT Time Calculation (min)  42 min    Activity Tolerance  Patient tolerated treatment well    Behavior During Therapy  [facility] for tasks assessed/performed       Past Medical History:  Diagnosis Date  . HTN (hypertension)   . Hx: UTI (urinary tract infection)   . OA (osteoarthritis)   . Positive PPD   . Sciatica   . Vaginal atrophy     Past Surgical History:  Procedure Laterality Date  . APPENDECTOMY    . CESAREAN SECTION    . ENDOMETRIAL ABLATION    . INGUINAL HERNIA REPAIR      There were no vitals filed for this visit.  Subjective Assessment - 10/05/19 1150    Subjective  I sat at lunch for 2 hrs, majang 3 hr and cocktail party 2 hours. After a lot sitting it is still painful. BEst exercises are supine figure 4 and pigeon pose.    Patient Stated Goals  Patient would like to not have this pain    Currently in Pain?  No/denies    Pain Radiating Towards  tingling in Rt foot right now.                        [facility] Adult PT Treatment/Exercise - 10/05/19 0001      Lumbar Exercises: Supine   Other Supine Lumbar Exercises  mini crunch with Rt sidebend  (Pended)       Lumbar Exercises: Quadruped   Single Arm Raises Limitations  Left side only  (Pended)     Other Quadruped Lumbar Exercises  small range cat-camel with focus on core engagement  (Pended)     Other Quadruped Lumbar Exercises  primal push up  (Pended)       Manual Therapy   Manual Therapy  Joint  mobilization  (Pended)     Manual therapy comments  skilled palpation and monitoring during TPDN    Joint Mobilization  lumbar Gr 3 PA, lumbar traction  (Pended)     Soft tissue mobilization  Rt piriformis & glut med, lumbar paraspinals  (Pended)        Trigger Point Dry Needling - 10/05/19 0001    Consent Given?  Yes    Education Handout Provided  --   verbal education   Muscles Treated Back/Hip  Gluteus medius;Piriformis    Gluteus Medius Response  Twitch response elicited;Palpable increased muscle length   Rt   Piriformis Response  Twitch response elicited;Palpable increased muscle length   Rt          PT Education - 10/05/19 1245    Education Details  TPDN &expected outcomes, scoliosis and possible contribution    Person(s) Educated  Patient    Methods  Explanation;Handout    Comprehension  Verbalized understanding          PT Long Term Goals - 09/20/19 1642  PT LONG TERM GOAL #1   Title  pt will be I with all HEP given as of last visit    Status  On-going      PT LONG TERM GOAL #2   Title  pt will increase R hip extensor/ abductor strength to >/= 4+/5 to promote safety with walking/ standing activities    Status  On-going      PT LONG TERM GOAL #3   Title  She will be able to sit for >/= 30 min with </= 1/10 pain in the hip and no report of tingling down the RLE (08/20/2016)    Status  On-going      PT LONG TERM GOAL #4   Title  pt will be able to push the vacuum, perform light housework without increase in pain    Status  On-going            Plan - 10/05/19 1239    Clinical Impression Statement  Tightness found in Rt glut med and piriformis and addressed with DN which pt tolerated well. The manual therapy, however, did not alter the tingling sensation in her foot. Is having an updated MRI tomorrow. Will re-assess tingling on Friday.    PT Treatment/Interventions  ADLs/Self Care Home Management;Electrical Stimulation;Cryotherapy;Iontophoresis  4mg /ml Dexamethasone;Moist Heat;Ultrasound;Therapeutic exercise;Dry needling;Passive range of motion;Patient/family education;Manual techniques;Therapeutic activities;Functional mobility training    PT Next Visit Plan  outcome of DN? continue abdominal engagement    PT Home Exercise Plan  prone press up,  multiple stretches for piriformis syndrome, SL clam and bridge with band, qped transv abdominis, primal push up    Consulted and Agree with Plan of Care  Patient       Patient will benefit from skilled therapeutic intervention in order to improve the following deficits and impairments:  Decreased strength, Decreased range of motion, Decreased mobility, Impaired flexibility, Increased fascial restricitons, Pain  Visit Diagnosis: Pain in right hip  Sensory disturbance  Radiculopathy, lumbar region  Muscle weakness (generalized)     Problem List Patient Active Problem List   Diagnosis Date Noted  . Acute pain of right knee 03/30/2017  . Sciatica of right side 05/19/2016  . Irritable bowel syndrome 12/29/2015  . Osteopenia 12/14/2015  . Depression 08/25/2013  . Back pain 11/13/2011  . Allergic rhinitis 11/13/2011  . Hearing loss 04/03/2011  . HTN (hypertension) 10/31/2010  . Anxiety 10/31/2010  . Insomnia 10/31/2010    Khaniya Tenaglia C. Jemima Petko PT, DPT 10/05/19 12:45 PM   [facility] [address] [place], [place], [address] Phone: [phone]   Fax:  [phone]  Name: Amber Garrett MRN: [id number] Date of Birth: 1941-02-13

## 2019-10-06 ENCOUNTER — Ambulatory Visit
Admission: RE | Admit: 2019-10-06 | Discharge: 2019-10-06 | Disposition: A | Discharge status: Home / Self Care | Payer: Medicare Other | Source: Ambulatory Visit | Attending: Sports Medicine | Admitting: Sports Medicine

## 2019-10-06 DIAGNOSIS — M5416 Radiculopathy, lumbar region: Secondary | ICD-10-CM

## 2019-10-06 DIAGNOSIS — M48061 Spinal stenosis, lumbar region without neurogenic claudication: Secondary | ICD-10-CM | POA: Diagnosis not present

## 2019-10-07 ENCOUNTER — Encounter: Payer: Self-pay | Admitting: Physical Therapy

## 2019-10-07 ENCOUNTER — Ambulatory Visit: Payer: Medicare Other | Admitting: Physical Therapy

## 2019-10-07 ENCOUNTER — Other Ambulatory Visit: Payer: Self-pay

## 2019-10-07 DIAGNOSIS — M6281 Muscle weakness (generalized): Secondary | ICD-10-CM

## 2019-10-07 DIAGNOSIS — R209 Unspecified disturbances of skin sensation: Secondary | ICD-10-CM

## 2019-10-07 DIAGNOSIS — M25551 Pain in right hip: Secondary | ICD-10-CM | POA: Diagnosis not present

## 2019-10-07 DIAGNOSIS — M5416 Radiculopathy, lumbar region: Secondary | ICD-10-CM

## 2019-10-07 NOTE — Therapy (Signed)
[place], [place], [address] Phone: [phone]   Fax:  [phone]  Physical Therapy Treatment  Patient Details  Name: Amber Garrett MRN: [id number] Date of Birth: [birth year] Referring Provider (PT): Dr. Lilia Argue    Encounter Date: 10/07/2019  PT End of Session - 10/07/19 1143    Visit Number  4    Number of Visits  12    Date for PT Re-Evaluation  10/27/19    PT Start Time  1100    PT Stop Time  1139    PT Time Calculation (min)  39 min    Activity Tolerance  Patient tolerated treatment well    Behavior During Therapy  [facility] for tasks assessed/performed       Past Medical History:  Diagnosis Date  . HTN (hypertension)   . Hx: UTI (urinary tract infection)   . OA (osteoarthritis)   . Positive PPD   . Sciatica   . Vaginal atrophy     Past Surgical History:  Procedure Laterality Date  . APPENDECTOMY    . CESAREAN SECTION    . ENDOMETRIAL ABLATION    . INGUINAL HERNIA REPAIR      There were no vitals filed for this visit.  Subjective Assessment - 10/07/19 1103    Subjective  I had a long day of standing yesterday. was not able to sit at dinner with friends for very long due to pain. put ice on foot last night to numb it. Denies any pain right now, just discomfort in foot.    Patient Stated Goals  Patient would like to not have this pain    Currently in Pain?  No/denies                        [facility] Adult PT Treatment/Exercise - 10/07/19 0001      Lumbar Exercises: Sidelying   Other Sidelying Lumbar Exercises  sidelying arcs      Lumbar Exercises: Prone   Other Prone Lumbar Exercises  prone press ups with small range      Manual Therapy   Joint Mobilization  Rt LE LAD    Soft tissue mobilization  bil piriformis, Rt gluts, Rt illiopsoas             PT Education - 10/07/19 1143    Education Details  anatomy of codnition, POC, MRI    Person(s) Educated   Patient    Methods  Explanation    Comprehension  Verbalized understanding;Need further instruction          PT Long Term Goals - 09/20/19 1642      PT LONG TERM GOAL #1   Title  pt will be I with all HEP given as of last visit    Status  On-going      PT LONG TERM GOAL #2   Title  pt will increase R hip extensor/ abductor strength to >/= 4+/5 to promote safety with walking/ standing activities    Status  On-going      PT LONG TERM GOAL #3   Title  She will be able to sit for >/= 30 min with </= 1/10 pain in the hip and no report of tingling down the RLE (08/20/2016)    Status  On-going      PT LONG TERM GOAL #4   Title  pt will be able to push the vacuum, perform light housework without increase in  pain    Status  On-going            Plan - 10/07/19 1144    Clinical Impression Statement  Noted to have significant tightness in Rt illiopsoas and thinks this may have had some impact on the discomfort in her foot. Made prone extensions smaller to reduce lumbar irritation.    PT Treatment/Interventions  ADLs/Self Care Home Management;Electrical Stimulation;Cryotherapy;Iontophoresis 4mg /ml Dexamethasone;Moist Heat;Ultrasound;Therapeutic exercise;Dry needling;Passive range of motion;Patient/family education;Manual techniques;Therapeutic activities;Functional mobility training    PT Next Visit Plan  outcome of illiopsoas release?    PT Home Exercise Plan  prone press up,  multiple stretches for piriformis syndrome, SL clam and bridge with band, qped transv abdominis, primal push up    Consulted and Agree with Plan of Care  Patient       Patient will benefit from skilled therapeutic intervention in order to improve the following deficits and impairments:  Decreased strength, Decreased range of motion, Decreased mobility, Impaired flexibility, Increased fascial restricitons, Pain  Visit Diagnosis: Pain in right hip  Sensory disturbance  Radiculopathy, lumbar region  Muscle  weakness (generalized)     Problem List Patient Active Problem List   Diagnosis Date Noted  . Acute pain of right knee 03/30/2017  . Sciatica of right side 05/19/2016  . Irritable bowel syndrome 12/29/2015  . Osteopenia 12/14/2015  . Depression 08/25/2013  . Back pain 11/13/2011  . Allergic rhinitis 11/13/2011  . Hearing loss 04/03/2011  . HTN (hypertension) 10/31/2010  . Anxiety 10/31/2010  . Insomnia 10/31/2010    Nicci Vaughan C. Bryann Mcnealy PT, DPT 10/07/19 12:34 PM   [facility] [address] [place], [place], [address] Phone: [phone]   Fax:  [phone]  Name: Amber Garrett MRN: [id number] Date of Birth: 07-26-1940

## 2019-10-10 ENCOUNTER — Encounter: Payer: Self-pay | Admitting: Physical Therapy

## 2019-10-10 ENCOUNTER — Ambulatory Visit: Payer: Medicare Other | Admitting: Physical Therapy

## 2019-10-10 ENCOUNTER — Other Ambulatory Visit: Payer: Self-pay

## 2019-10-10 DIAGNOSIS — M5416 Radiculopathy, lumbar region: Secondary | ICD-10-CM

## 2019-10-10 DIAGNOSIS — M25551 Pain in right hip: Secondary | ICD-10-CM | POA: Diagnosis not present

## 2019-10-10 DIAGNOSIS — M6281 Muscle weakness (generalized): Secondary | ICD-10-CM

## 2019-10-10 DIAGNOSIS — R209 Unspecified disturbances of skin sensation: Secondary | ICD-10-CM

## 2019-10-10 NOTE — Therapy (Signed)
[place], [place], [address] Phone: [phone]   Fax:  [phone]  Physical Therapy Treatment  Patient Details  Name: Amber Garrett MRN: [id number] Date of Birth: Sep 27, 1940 Referring Provider (PT): Dr. Lilia Argue    Encounter Date: 10/10/2019  PT End of Session - 10/10/19 1226    Visit Number  5    Number of Visits  12    Date for PT Re-Evaluation  10/27/19    PT Start Time  1220    PT Stop Time  1305    PT Time Calculation (min)  45 min    Activity Tolerance  Patient tolerated treatment well    Behavior During Therapy  [facility] for tasks assessed/performed       Past Medical History:  Diagnosis Date  . HTN (hypertension)   . Hx: UTI (urinary tract infection)   . OA (osteoarthritis)   . Positive PPD   . Sciatica   . Vaginal atrophy     Past Surgical History:  Procedure Laterality Date  . APPENDECTOMY    . CESAREAN SECTION    . ENDOMETRIAL ABLATION    . INGUINAL HERNIA REPAIR      There were no vitals filed for this visit.  Subjective Assessment - 10/10/19 1222    Subjective  I was pain free all weekend.  The bodywork helped (last PT session ) Today , 1/10 in buttock Rt side.    Currently in Pain?  Yes    Pain Score  1     Pain Location  Hip    Pain Orientation  Right;Posterior    Pain Descriptors / Indicators  Aching    Pain Type  Chronic pain    Pain Onset  More than a month ago    Aggravating Factors   sitting    Pain Relieving Factors  manual , stretch          [facility] Adult PT Treatment/Exercise - 10/10/19 0001      Pilates   Pilates Reformer  see note       Lumbar Exercises: Stretches   Hip Flexor Stretch  2 reps;30 seconds    Hip Flexor Stretch Limitations  on Reformer and off table       Lumbar Exercises: Sidelying   Clam  Both;15 reps    Clam Limitations  blue       Knee/Hip Exercises: Stretches   Piriformis Stretch Limitations  Rt LE pigeon pose in  standing , and figure 4 30 sec x 3 each        Pilates Reformer used for LE/core strength, postural strength, lumbopelvic disassociation and core control.  Exercises included:  Footwork sidelying single leg 2 Red : parallel and then turnout   Clam blue band x 15  Bridging with band x 10, cues for control Bridge with clam partial   Feet in Straps 1 Red 1 yellow   Arcs in parallel, turnout and internal rotation   Single leg 1 Red hamstring, ITB , adductor stretches x 30 sec x 1 each     PT Education - 10/10/19 1239    Education Details  Piriformis and pilates, hip strength    Person(s) Educated  Patient    Methods  Explanation    Comprehension  Verbalized understanding;Returned demonstration          PT Long Term Goals - 10/10/19 1405      PT LONG TERM GOAL #1   Title  pt will be I with all HEP given as of last visit    Status  On-going      PT LONG TERM GOAL #2   Title  pt will increase R hip extensor/ abductor strength to >/= 4+/5 to promote safety with walking/ standing activities    Status  Unable to assess      PT LONG TERM GOAL #3   Title  She will be able to sit for >/= 30 min with </= 1/10 pain in the hip and no report of tingling down the RLE (08/20/2016)    Baseline  able to sit with only min discomfort for 30 min, but this past weekend it did not bother her.    Status  On-going      PT LONG TERM GOAL #4   Title  pt will be able to push the vacuum, perform light housework without increase in pain    Status  Unable to assess            Plan - 10/10/19 1241    Clinical Impression Statement  Patient with min overall pain, benefitted from ant hip release last visit. She is improving overall, had little to no pain in hip today,  L side was cramping with certain exercises.  used Pilates Reformer today (she has done in the past) to educate about core and neutral spine.    PT Treatment/Interventions  ADLs/Self Care Home Management;Electrical  Stimulation;Cryotherapy;Iontophoresis 4mg /ml Dexamethasone;Moist Heat;Ultrasound;Therapeutic exercise;Dry needling;Passive range of motion;Patient/family education;Manual techniques;Therapeutic activities;Functional mobility training    PT Next Visit Plan  will be out of town until late next week. check goals, sitting tolerance. Neutral core    Consulted and Agree with Plan of Care  Patient       Patient will benefit from skilled therapeutic intervention in order to improve the following deficits and impairments:     Visit Diagnosis: Pain in right hip  Sensory disturbance  Radiculopathy, lumbar region  Muscle weakness (generalized)     Problem List Patient Active Problem List   Diagnosis Date Noted  . Acute pain of right knee 03/30/2017  . Sciatica of right side 05/19/2016  . Irritable bowel syndrome 12/29/2015  . Osteopenia 12/14/2015  . Depression 08/25/2013  . Back pain 11/13/2011  . Allergic rhinitis 11/13/2011  . Hearing loss 04/03/2011  . HTN (hypertension) 10/31/2010  . Anxiety 10/31/2010  . Insomnia 10/31/2010    Kaiya Boatman 10/10/2019, 2:09 PM  [facility] [address] [place], [place], [address] Phone: [phone]   Fax:  [phone]  Name: ARMELLA STOGNER MRN: [id number] Date of Birth: 1940/09/19  Raeford Razor, PT 10/10/19 2:10 PM Phone: [phone] Fax: [phone]

## 2019-10-12 ENCOUNTER — Telehealth: Payer: Self-pay | Admitting: Sports Medicine

## 2019-10-12 NOTE — Telephone Encounter (Signed)
°  I spoke with Amber Garrett on the phone yesterday after reviewing the MRI of her lumbar spine done on June 3rd.  When comparing the MRI to one done in 2018 there has been mild progression of her multilevel degenerative changes.  She does not appear to have any significant right-sided nerve root compression.  She tells me that she is responding very well to formal physical therapy.  At this point, I recommended that she continue with physical therapy until they feel like she is ready for discharge.  I am hopeful that her radiculopathy will improve with continued home exercises given the fact that there is no significant stenosis seen on the right side of her spine. However, if she does continue to have symptoms despite PT, then we could consider a lumbar ESI.  She will follow-up with me as needed.

## 2019-10-20 ENCOUNTER — Ambulatory Visit: Payer: Medicare Other | Admitting: Physical Therapy

## 2019-10-20 ENCOUNTER — Encounter: Payer: Self-pay | Admitting: Physical Therapy

## 2019-10-20 ENCOUNTER — Other Ambulatory Visit: Payer: Self-pay

## 2019-10-20 DIAGNOSIS — M5416 Radiculopathy, lumbar region: Secondary | ICD-10-CM

## 2019-10-20 DIAGNOSIS — M6281 Muscle weakness (generalized): Secondary | ICD-10-CM

## 2019-10-20 DIAGNOSIS — M25551 Pain in right hip: Secondary | ICD-10-CM | POA: Diagnosis not present

## 2019-10-20 DIAGNOSIS — R209 Unspecified disturbances of skin sensation: Secondary | ICD-10-CM

## 2019-10-20 NOTE — Therapy (Signed)
Amber Garrett, [place], [address] Phone: [phone]   Fax:  [phone]  Physical Therapy Treatment  Patient Details  Name: Amber Garrett MRN: [id number] Date of Birth: [birth year] Referring Provider (PT): Dr. Lilia Argue    Encounter Date: 10/20/2019   PT End of Session - 10/20/19 1244    Visit Number 6    Number of Visits 12    Date for PT Re-Evaluation 10/27/19    PT Start Time 3893    PT Stop Time 1320    PT Time Calculation (min) 45 min    Activity Tolerance Patient tolerated treatment well    Behavior During Therapy [facility] for tasks assessed/performed           Past Medical History:  Diagnosis Date  . HTN (hypertension)   . Hx: UTI (urinary tract infection)   . OA (osteoarthritis)   . Positive PPD   . Sciatica   . Vaginal atrophy     Past Surgical History:  Procedure Laterality Date  . APPENDECTOMY    . CESAREAN SECTION    . ENDOMETRIAL ABLATION    . INGUINAL HERNIA REPAIR      There were no vitals filed for this visit.   Subjective Assessment - 10/20/19 1239    Subjective Foot is hurting now.  It is uncomfortable.  Numb and tingling.  She got back yesterday from [place], sitting for 9 hours off and on.    Currently in Pain? Yes    Pain Score 1     Pain Location Foot   and also buttock   Pain Orientation Right    Pain Descriptors / Indicators Tingling;Discomfort    Pain Type Chronic pain    Pain Onset More than a month ago    Pain Frequency Intermittent    Aggravating Factors  sitting    Pain Relieving Factors stretching, manual    Multiple Pain Sites No              [facility] PT Assessment - 10/20/19 0001      Strength   Right Hip Extension 4/5    Right Hip ABduction 3-/5    Left Hip Extension 4+/5    Left Hip ABduction 4+/5    Right Knee Flexion 4/5    Left Knee Flexion 5/5    Right Ankle Dorsiflexion 5/5    Left Ankle Dorsiflexion 5/5                   [facility] Adult PT Treatment/Exercise - 10/20/19 0001      Self-Care   Other Self-Care Comments  Cont POC due to continued symptoms , reduce to 1x per week       Lumbar Exercises: Stretches   Active Hamstring Stretch 2 reps;30 seconds    Prone on Elbows Stretch 5 reps;10 seconds    Press Ups 5 reps    ITB Stretch 2 reps;30 seconds    Piriformis Stretch 3 reps;30 seconds    Figure 4 Stretch 3 reps;30 seconds      Lumbar Exercises: Supine   Bridge 10 reps    Bridge Limitations articulating     Bridge with clamshell 10 reps      Lumbar Exercises: Sidelying   Clam Right;15 reps    Hip Abduction Both;10 reps    Hip Abduction Weights (lbs) 2 sets       Lumbar Exercises: Prone   Straight Leg Raise 10 reps    Straight  Leg Raises Limitations bilateral                        PT Long Term Goals - 10/20/19 1253      PT LONG TERM GOAL #1   Title pt will be I with all HEP given as of last visit    Status On-going      PT LONG TERM GOAL #2   Title pt will increase R hip extensor/ abductor strength to >/= 4+/5 to promote safety with walking/ standing activities    Baseline Rt hip 3-/5 today    Status On-going      PT LONG TERM GOAL #3   Title She will be able to sit for >/= 30 min with </= 1/10 pain in the hip and no report of tingling down the RLE (08/20/2016)    Baseline discomfort in foot is fairly constant    Status On-going      PT LONG TERM GOAL #4   Title pt will be able to push the vacuum, perform light housework without increase in pain    Baseline does not really do this anymore, has someone to help her, has back pain with this    Status Not Met                 Plan - 10/20/19 1304    Clinical Impression Statement [name] continues to have consistent discomfort in Rt foot, tingling which does not change much throughout the day. Generally feels good in AM, but no matter how much she stands, she has the pain.  She was flared today due to  such a large amount of sitting with travel yesterday. I urged her to continue but at 1x per week for continued feedback, treatment aimed at core strengtn, hip strength. She already does 3 x week sessions with trainer but did need some cues for form today and did not exercise much on her trip.    Personal Factors and Comorbidities Past/Current Experience;Age;Comorbidity 1    Comorbidities history of similar symptoms, HTN    Examination-Activity Limitations Bed Mobility;Sit;Lift    Examination-Participation Restrictions Community Activity;Cleaning    Stability/Clinical Decision Making Stable/Uncomplicated    Rehab Potential Excellent    PT Frequency 1x / week    PT Duration 4 weeks    PT Treatment/Interventions ADLs/Self Care Home Management;Electrical Stimulation;Cryotherapy;Iontophoresis 51m/ml Dexamethasone;Moist Heat;Ultrasound;Therapeutic exercise;Dry needling;Passive range of motion;Patient/family education;Manual techniques;Therapeutic activities;Functional mobility training    PT Next Visit Plan neutral core, add to HEP , standing hip abd, glute med    PT Home Exercise Plan prone press up,  multiple stretches for piriformis syndrome, SL clam and bridge with band, qped transv abdominis, primal push up    Consulted and Agree with Plan of Care Patient           Patient will benefit from skilled therapeutic intervention in order to improve the following deficits and impairments:  Decreased strength, Decreased range of motion, Decreased mobility, Impaired flexibility, Increased fascial restricitons, Pain  Visit Diagnosis: Pain in right hip  Sensory disturbance  Radiculopathy, lumbar region  Muscle weakness (generalized)     Problem List Patient Active Problem List   Diagnosis Date Noted  . Acute pain of right knee 03/30/2017  . Sciatica of right side 05/19/2016  . Irritable bowel syndrome 12/29/2015  . Osteopenia 12/14/2015  . Depression 08/25/2013  . Back pain 11/13/2011  .  Allergic rhinitis 11/13/2011  . Hearing loss 04/03/2011  .  HTN (hypertension) 10/31/2010  . Anxiety 10/31/2010  . Insomnia 10/31/2010    Jannatul Wojdyla 10/20/2019, 1:40 PM  [facility] [address] [place], [place], [address] Phone: [phone]   Fax:  [phone]  Name: Amber Garrett MRN: [id number] Date of Birth: May 17, 1940  Raeford Razor, PT 10/20/19 1:40 PM Phone: [phone] Fax: [phone]

## 2019-10-24 ENCOUNTER — Ambulatory Visit: Payer: Medicare Other | Admitting: Physical Therapy

## 2019-10-26 ENCOUNTER — Encounter: Payer: Self-pay | Admitting: Physical Therapy

## 2019-10-26 ENCOUNTER — Telehealth: Payer: Self-pay | Admitting: Physical Therapy

## 2019-10-26 ENCOUNTER — Ambulatory Visit: Payer: Medicare Other | Admitting: Physical Therapy

## 2019-10-26 ENCOUNTER — Other Ambulatory Visit: Payer: Self-pay

## 2019-10-26 DIAGNOSIS — M5416 Radiculopathy, lumbar region: Secondary | ICD-10-CM

## 2019-10-26 DIAGNOSIS — M6281 Muscle weakness (generalized): Secondary | ICD-10-CM

## 2019-10-26 DIAGNOSIS — R209 Unspecified disturbances of skin sensation: Secondary | ICD-10-CM

## 2019-10-26 DIAGNOSIS — M25551 Pain in right hip: Secondary | ICD-10-CM | POA: Diagnosis not present

## 2019-10-26 NOTE — Telephone Encounter (Signed)
Opened in error Raeford Razor, PT 10/26/19 1:26 PM Phone: [phone] Fax: [phone]

## 2019-10-26 NOTE — Therapy (Signed)
[place], [place], [address] Phone: [phone]   Fax:  [phone]  Physical Therapy Treatment  Patient Details  Name: Amber Garrett MRN: [id number] Date of Birth: 01/11/1941 Referring Provider (PT): Dr. Lilia Argue    Encounter Date: 10/26/2019   PT End of Session - 10/26/19 1051    Visit Number 7    Number of Visits 12    Date for PT Re-Evaluation 11/18/19    PT Start Time 1046    PT Stop Time 1130    PT Time Calculation (min) 44 min    Activity Tolerance Patient tolerated treatment well    Behavior During Therapy [facility] for tasks assessed/performed           Past Medical History:  Diagnosis Date  . HTN (hypertension)   . Hx: UTI (urinary tract infection)   . OA (osteoarthritis)   . Positive PPD   . Sciatica   . Vaginal atrophy     Past Surgical History:  Procedure Laterality Date  . APPENDECTOMY    . CESAREAN SECTION    . ENDOMETRIAL ABLATION    . INGUINAL HERNIA REPAIR      There were no vitals filed for this visit.   Subjective Assessment - 10/26/19 1048    Subjective Foot is still doing its thing.  usually worse in the evenings. Back was hurting yesterday.    Currently in Pain? No/denies    Pain Radiating Towards tingling Rt foot               [facility] Adult PT Treatment/Exercise - 10/26/19 0001      Lumbar Exercises: Stretches   Active Hamstring Stretch 2 reps;30 seconds    ITB Stretch 2 reps;30 seconds    Piriformis Stretch 3 reps;30 seconds      Lumbar Exercises: Standing   Functional Squats 10 reps    Functional Squats Limitations 3 sets x 15 lbs     Lifting Weights (lbs) 15 dead lift       Lumbar Exercises: Sidelying   Clam Both;20 reps    Hip Abduction Both;20 reps      Lumbar Exercises: Quadruped   Madcat/Old Horse 10 reps    Other Quadruped Lumbar Exercises childs pose       Knee/Hip Exercises: Standing   Hip Abduction Stengthening    Abduction  Limitations blue band x 15       Knee/Hip Exercises: Supine   Bridges Strengthening;Both;20 reps    Single Leg Bridge Strengthening;Both;1 set;10 reps      Manual Therapy   Soft tissue mobilization R piriformis and glutes     Passive ROM hip ER/IR with compression                        PT Long Term Goals - 10/20/19 1253      PT LONG TERM GOAL #1   Title pt will be I with all HEP given as of last visit    Status On-going      PT LONG TERM GOAL #2   Title pt will increase R hip extensor/ abductor strength to >/= 4+/5 to promote safety with walking/ standing activities    Baseline Rt hip 3-/5 today    Status On-going      PT LONG TERM GOAL #3   Title Amber Garrett will be able to sit for >/= 30 min with </= 1/10 pain in the hip and no report  of tingling down the RLE (08/20/2016)    Baseline discomfort in foot is fairly constant    Status On-going      PT LONG TERM GOAL #4   Title pt will be able to push the vacuum, perform light housework without increase in pain    Baseline does not really do this anymore, has someone to help her, has back pain with this    Status Not Met                 Plan - 10/26/19 1141    Clinical Impression Statement Recommended patient stretch after walking daily.  Worked on improving symmetry with hip abductor strength. Trigger point in Rt piriformis addressed end of session.    PT Treatment/Interventions ADLs/Self Care Home Management;Electrical Stimulation;Cryotherapy;Iontophoresis 43m/ml Dexamethasone;Moist Heat;Ultrasound;Therapeutic exercise;Dry needling;Passive range of motion;Patient/family education;Manual techniques;Therapeutic activities;Functional mobility training    PT Next Visit Plan neutral core, add to HEP , standing hip abd, glute med    PT Home Exercise Plan prone press up,  multiple stretches for piriformis syndrome, SL clam and bridge with band, qped transv abdominis, primal push up    Consulted and Agree with Plan of Care  Patient           Patient will benefit from skilled therapeutic intervention in order to improve the following deficits and impairments:  Decreased strength, Decreased range of motion, Decreased mobility, Impaired flexibility, Increased fascial restricitons, Pain  Visit Diagnosis: Pain in right hip  Sensory disturbance  Radiculopathy, lumbar region  Muscle weakness (generalized)     Problem List Patient Active Problem List   Diagnosis Date Noted  . Acute pain of right knee 03/30/2017  . Sciatica of right side 05/19/2016  . Irritable bowel syndrome 12/29/2015  . Osteopenia 12/14/2015  . Depression 08/25/2013  . Back pain 11/13/2011  . Allergic rhinitis 11/13/2011  . Hearing loss 04/03/2011  . HTN (hypertension) 10/31/2010  . Anxiety 10/31/2010  . Insomnia 10/31/2010    Aleshka Corney 10/26/2019, 11:46 AM  [facility] [address]Phone: [phone]  Fax:  [phone] Name: Amber FREIMUTHMRN: [id number] of Birth: 713-May-1942 JRaeford Razor PT 10/26/19 11:46 AM Phone: [phone]: [phone]

## 2019-10-31 ENCOUNTER — Encounter: Payer: Medicare Other | Admitting: Physical Therapy

## 2019-11-02 ENCOUNTER — Ambulatory Visit: Payer: Medicare Other | Admitting: Physical Therapy

## 2019-11-02 ENCOUNTER — Other Ambulatory Visit: Payer: Self-pay

## 2019-11-02 DIAGNOSIS — M5416 Radiculopathy, lumbar region: Secondary | ICD-10-CM

## 2019-11-02 DIAGNOSIS — R209 Unspecified disturbances of skin sensation: Secondary | ICD-10-CM

## 2019-11-02 DIAGNOSIS — M25551 Pain in right hip: Secondary | ICD-10-CM

## 2019-11-02 DIAGNOSIS — M6281 Muscle weakness (generalized): Secondary | ICD-10-CM

## 2019-11-02 NOTE — Therapy (Signed)
[place], [place], [address] Phone: [phone]   Fax:  [phone]  Physical Therapy Treatment/Discharge  Patient Details  Name: Amber Garrett MRN: [id number] Date of Birth: [birth year] Referring Provider (PT): Dr. Lilia Argue    Encounter Date: 11/02/2019   PT End of Session - 11/02/19 1050    Visit Number 8    Number of Visits 12    Date for PT Re-Evaluation 11/18/19    PT Start Time 1050    PT Stop Time 1117    PT Time Calculation (min) 27 min    Activity Tolerance Patient tolerated treatment well    Behavior During Therapy [facility] for tasks assessed/performed           Past Medical History:  Diagnosis Date  . HTN (hypertension)   . Hx: UTI (urinary tract infection)   . OA (osteoarthritis)   . Positive PPD   . Sciatica   . Vaginal atrophy     Past Surgical History:  Procedure Laterality Date  . APPENDECTOMY    . CESAREAN SECTION    . ENDOMETRIAL ABLATION    . INGUINAL HERNIA REPAIR      There were no vitals filed for this visit.   Subjective Assessment - 11/02/19 1051    Subjective I am good.  I am even having less numbnes in the foot.  Walks 4 miles per day x 5 days , worked with trainer this AM.    Currently in Pain? No/denies              [facility] PT Assessment - 11/02/19 0001      AROM   Overall AROM Comments WNL no pain all planes       Strength   Right Hip Extension 4/5    Right Hip ABduction 3/5    Left Hip Extension 4+/5    Left Hip ABduction 4+/5             [facility] Adult PT Treatment/Exercise - 11/02/19 0001      Self-Care   Other Self-Care Comments  strength gains, core, form with exercises and goals, DC       Lumbar Exercises: Stretches   Lower Trunk Rotation 3 reps;20 seconds    Lower Trunk Rotation Limitations knees crossed     Piriformis Stretch 3 reps;30 seconds      Lumbar Exercises: Sidelying   Clam Both;20 reps    Hip Abduction Both;20 reps       Lumbar Exercises: Prone   Straight Leg Raise 10 reps                  PT Long Term Goals - 11/02/19 1102      PT LONG TERM GOAL #1   Title pt will be I with all HEP given as of last visit    Status Achieved      PT LONG TERM GOAL #2   Title pt will increase R hip extensor/ abductor strength to >/= 4+/5 to promote safety with walking/ standing activities    Baseline Rt hip 3/5 today, had already exercised a great deal    Status Partially Met      PT LONG TERM GOAL #3   Title She will be able to sit for >/= 30 min with </= 1/10 pain in the hip and no report of tingling down the RLE (08/20/2016)    Baseline sat yesterday 2 hours    Status Achieved  PT LONG TERM GOAL #4   Title pt will be able to push the vacuum, perform light housework without increase in pain    Status Achieved                 Plan - 11/02/19 1117    Clinical Impression Statement Patient has met her goals other than Rt hip strength in extensors /abductors, although it is improved.  This is a chronic issue. She has many outlets to exercises and is committed. No longer having peripheral symptoms.  FOTO score essentially unchanged but pain is more hip than lumbar.  DC from PT today .    PT Next Visit Plan DC    PT Home Exercise Plan hip extension, abduction , prone press up,  multiple stretches for piriformis syndrome, SL clam and bridge with band, qped transv abdominis, primal push up    Consulted and Agree with Plan of Care Patient           Patient will benefit from skilled therapeutic intervention in order to improve the following deficits and impairments:     Visit Diagnosis: Pain in right hip  Sensory disturbance  Radiculopathy, lumbar region  Muscle weakness (generalized)     Problem List Patient Active Problem List   Diagnosis Date Noted  . Acute pain of right knee 03/30/2017  . Sciatica of right side 05/19/2016  . Irritable bowel syndrome 12/29/2015  . Osteopenia  12/14/2015  . Depression 08/25/2013  . Back pain 11/13/2011  . Allergic rhinitis 11/13/2011  . Hearing loss 04/03/2011  . HTN (hypertension) 10/31/2010  . Anxiety 10/31/2010  . Insomnia 10/31/2010    Jhanvi Drakeford 11/02/2019, 11:23 AM  [facility] [address] [place], [place], [address] Phone: [phone]   Fax:  [phone]  Name: Amber Garrett MRN: [id number] Date of Birth: [birth year]   PHYSICAL THERAPY DISCHARGE SUMMARY  Visits from Start of Care: 8  Current functional level related to goals / functional outcomes: Not limited by pain and cannot recall last episode of pain   Remaining deficits: Rt hip weakness 3+/5  to 4+/5     Education / Equipment: Posture, lifting, core, HEP, body mechanics for vacuuming  Plan: Patient agrees to discharge.  Patient goals were met. Patient is being discharged due to meeting the stated rehab goals.  ?????    Raeford Razor, PT 11/02/19 11:25 AM Phone: [phone] Fax: [phone]

## 2019-12-06 ENCOUNTER — Encounter: Payer: Self-pay | Admitting: Internal Medicine

## 2019-12-06 DIAGNOSIS — M85851 Other specified disorders of bone density and structure, right thigh: Secondary | ICD-10-CM | POA: Diagnosis not present

## 2019-12-06 DIAGNOSIS — Z1231 Encounter for screening mammogram for malignant neoplasm of breast: Secondary | ICD-10-CM | POA: Diagnosis not present

## 2019-12-06 DIAGNOSIS — M85852 Other specified disorders of bone density and structure, left thigh: Secondary | ICD-10-CM | POA: Diagnosis not present

## 2019-12-06 LAB — HM MAMMOGRAPHY

## 2019-12-07 ENCOUNTER — Encounter: Payer: Self-pay | Admitting: Internal Medicine

## 2019-12-08 ENCOUNTER — Encounter: Payer: Self-pay | Admitting: Internal Medicine

## 2019-12-09 ENCOUNTER — Encounter: Payer: Self-pay | Admitting: Internal Medicine

## 2019-12-29 ENCOUNTER — Telehealth: Payer: Self-pay | Admitting: Internal Medicine

## 2019-12-29 ENCOUNTER — Telehealth (INDEPENDENT_AMBULATORY_CARE_PROVIDER_SITE_OTHER): Payer: Medicare Other | Admitting: Internal Medicine

## 2019-12-29 DIAGNOSIS — T426X5A Adverse effect of other antiepileptic and sedative-hypnotic drugs, initial encounter: Secondary | ICD-10-CM | POA: Diagnosis not present

## 2019-12-29 DIAGNOSIS — R413 Other amnesia: Secondary | ICD-10-CM

## 2019-12-29 MED ORDER — ESZOPICLONE 2 MG PO TABS: 2.0000 mg | ORAL_TABLET | Freq: Every evening | ORAL | 5 refills | Status: DC | PRN

## 2019-12-29 NOTE — Telephone Encounter (Signed)
Aeriel Boulay [phone]   Peggy called to say she would like to go back on the below medication for sleep instead on the Ambien.   eszopiclone (LUNESTA) 2 MG TABS tablet  [facility] - [place], [place] - [address] Phone:  [phone]  Fax:  [phone]

## 2019-12-29 NOTE — Telephone Encounter (Signed)
I spoke with patient personally by telephone this afternoon.  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  She is requesting to be changed back to [facility] from Ambien.  She spent some time with her daughter and apparently was up at night 1 night eating granola bars and does not recall it while taking Ambien.  It seems that it caused some amnesia.  We are going to start her on Lunesta.  She feels she needs something to sleep.  Will call in Lunesta 2 mg 1 p.o. nightly with refills.  This does not seem to bother her in terms of amnesia.  She has taken it before.  Also, she has had bone density study at [facility].  Bone density and right hip has improved from -2.4 to -1.9.  LS spine score has decreased slightly from -0.4 to -0.80.  I would recommend that she continue with vitamin D supplement.  Would not put her on Prolia at this point in time.  Repeat bone density study in 2 years.

## 2019-12-30 ENCOUNTER — Encounter: Payer: Self-pay | Admitting: Internal Medicine

## 2020-01-11 DIAGNOSIS — Z23 Encounter for immunization: Secondary | ICD-10-CM | POA: Diagnosis not present

## 2020-02-07 DIAGNOSIS — Z23 Encounter for immunization: Secondary | ICD-10-CM | POA: Diagnosis not present

## 2020-03-20 DIAGNOSIS — H04123 Dry eye syndrome of bilateral lacrimal glands: Secondary | ICD-10-CM | POA: Diagnosis not present

## 2020-03-20 DIAGNOSIS — H5213 Myopia, bilateral: Secondary | ICD-10-CM | POA: Diagnosis not present

## 2020-03-20 DIAGNOSIS — H524 Presbyopia: Secondary | ICD-10-CM | POA: Diagnosis not present

## 2020-03-20 DIAGNOSIS — H43813 Vitreous degeneration, bilateral: Secondary | ICD-10-CM | POA: Diagnosis not present

## 2020-03-20 DIAGNOSIS — H52223 Regular astigmatism, bilateral: Secondary | ICD-10-CM | POA: Diagnosis not present

## 2020-03-20 DIAGNOSIS — H26493 Other secondary cataract, bilateral: Secondary | ICD-10-CM | POA: Diagnosis not present

## 2020-06-28 ENCOUNTER — Telehealth: Payer: Self-pay | Admitting: Internal Medicine

## 2020-06-28 NOTE — Telephone Encounter (Signed)
Sabrinia Prien [phone]  [place] called to say she needs a referral for a hearing test sent to below.  [facility] Attn: Alfonse Ras  Fax [phone] Phone [phone]

## 2020-06-28 NOTE — Telephone Encounter (Signed)
I have written an order to be faxed

## 2020-06-29 NOTE — Telephone Encounter (Addendum)
Referral, demographics and order has been faxed to [facility], Attn: Alfonse Ras, [phone], phone [phone]

## 2020-07-05 ENCOUNTER — Other Ambulatory Visit: Payer: Self-pay | Admitting: Internal Medicine

## 2020-07-05 DIAGNOSIS — I10 Essential (primary) hypertension: Secondary | ICD-10-CM

## 2020-07-18 DIAGNOSIS — H903 Sensorineural hearing loss, bilateral: Secondary | ICD-10-CM | POA: Diagnosis not present

## 2020-08-02 ENCOUNTER — Other Ambulatory Visit: Payer: Medicare Other | Admitting: Internal Medicine

## 2020-08-02 ENCOUNTER — Other Ambulatory Visit: Payer: Self-pay

## 2020-08-02 DIAGNOSIS — Z0000 Encounter for general adult medical examination without abnormal findings: Secondary | ICD-10-CM

## 2020-08-02 DIAGNOSIS — M8580 Other specified disorders of bone density and structure, unspecified site: Secondary | ICD-10-CM | POA: Diagnosis not present

## 2020-08-02 DIAGNOSIS — I10 Essential (primary) hypertension: Secondary | ICD-10-CM | POA: Diagnosis not present

## 2020-08-02 DIAGNOSIS — E7800 Pure hypercholesterolemia, unspecified: Secondary | ICD-10-CM

## 2020-08-02 LAB — COMPLETE METABOLIC PANEL WITH GFR
AG Ratio: 1.7 (calc) (ref 1.0–2.5)
ALT: 12 U/L (ref 6–29)
AST: 19 U/L (ref 10–35)
Albumin: 4.4 g/dL (ref 3.6–5.1)
Alkaline phosphatase (APISO): 62 U/L (ref 37–153)
BUN: 24 mg/dL (ref 7–25)
CO2: 26 mmol/L (ref 20–32)
Calcium: 9.5 mg/dL (ref 8.6–10.4)
Chloride: 102 mmol/L (ref 98–110)
Creat: 0.92 mg/dL (ref 0.60–0.93)
GFR, Est African American: 70 mL/min/{1.73_m2} (ref >=60)
GFR, Est Non African American: 60 mL/min/{1.73_m2} (ref >=60)
Globulin: 2.6 g/dL (calc) (ref 1.9–3.7)
Glucose, Bld: 96 mg/dL (ref 65–99)
Potassium: 4.5 mmol/L (ref 3.5–5.3)
Sodium: 138 mmol/L (ref 135–146)
Total Bilirubin: 0.6 mg/dL (ref 0.2–1.2)
Total Protein: 7 g/dL (ref 6.1–8.1)

## 2020-08-02 LAB — LIPID PANEL
Cholesterol: 239 mg/dL — ABNORMAL HIGH (ref <200)
HDL: 82 mg/dL (ref >=50)
LDL Cholesterol (Calc): 137 mg/dL (calc) — ABNORMAL HIGH
Non-HDL Cholesterol (Calc): 157 mg/dL (calc) — ABNORMAL HIGH (ref <130)
Total CHOL/HDL Ratio: 2.9 (calc) (ref <5.0)
Triglycerides: 92 mg/dL (ref <150)

## 2020-08-02 LAB — TSH: TSH: 1.64 mIU/L (ref 0.40–4.50)

## 2020-08-02 LAB — CBC WITH DIFFERENTIAL/PLATELET
Absolute Monocytes: 640 cells/uL (ref 200–950)
Basophils Absolute: 32 cells/uL (ref 0–200)
Basophils Relative: 0.5 %
Eosinophils Absolute: 160 cells/uL (ref 15–500)
Eosinophils Relative: 2.5 %
HCT: 42.2 % (ref 35.0–45.0)
Hemoglobin: 14.1 g/dL (ref 11.7–15.5)
Lymphs Abs: 2496 cells/uL (ref 850–3900)
MCH: 31.9 pg (ref 27.0–33.0)
MCHC: 33.4 g/dL (ref 32.0–36.0)
MCV: 95.5 fL (ref 80.0–100.0)
MPV: 10.8 fL (ref 7.5–12.5)
Monocytes Relative: 10 %
Neutro Abs: 3072 cells/uL (ref 1500–7800)
Neutrophils Relative %: 48 %
Platelets: 241 10*3/uL (ref 140–400)
RBC: 4.42 10*6/uL (ref 3.80–5.10)
RDW: 12.1 % (ref 11.0–15.0)
Total Lymphocyte: 39 %
WBC: 6.4 10*3/uL (ref 3.8–10.8)

## 2020-08-06 ENCOUNTER — Ambulatory Visit (INDEPENDENT_AMBULATORY_CARE_PROVIDER_SITE_OTHER): Payer: Medicare Other | Admitting: Internal Medicine

## 2020-08-06 ENCOUNTER — Encounter: Payer: Self-pay | Admitting: Internal Medicine

## 2020-08-06 ENCOUNTER — Other Ambulatory Visit: Payer: Self-pay

## 2020-08-06 VITALS — BP 130/80 | HR 86 | Ht 63.0 in | Wt 150.0 lb

## 2020-08-06 DIAGNOSIS — I10 Essential (primary) hypertension: Secondary | ICD-10-CM

## 2020-08-06 DIAGNOSIS — H903 Sensorineural hearing loss, bilateral: Secondary | ICD-10-CM

## 2020-08-06 DIAGNOSIS — K580 Irritable bowel syndrome with diarrhea: Secondary | ICD-10-CM

## 2020-08-06 DIAGNOSIS — M8580 Other specified disorders of bone density and structure, unspecified site: Secondary | ICD-10-CM

## 2020-08-06 DIAGNOSIS — Z87898 Personal history of other specified conditions: Secondary | ICD-10-CM | POA: Diagnosis not present

## 2020-08-06 DIAGNOSIS — Z0000 Encounter for general adult medical examination without abnormal findings: Secondary | ICD-10-CM

## 2020-08-06 DIAGNOSIS — E7800 Pure hypercholesterolemia, unspecified: Secondary | ICD-10-CM

## 2020-08-06 LAB — POCT URINALYSIS DIPSTICK
Appearance: NEGATIVE
Bilirubin, UA: NEGATIVE
Blood, UA: NEGATIVE
Glucose, UA: NEGATIVE
Ketones, UA: NEGATIVE
Leukocytes, UA: NEGATIVE
Nitrite, UA: NEGATIVE
Odor: NEGATIVE
Protein, UA: NEGATIVE
Spec Grav, UA: 1.01 (ref 1.010–1.025)
Urobilinogen, UA: 0.2 E.U./dL
pH, UA: 6.5 (ref 5.0–8.0)

## 2020-08-06 MED ORDER — ROSUVASTATIN CALCIUM 5 MG PO TABS: ORAL_TABLET | ORAL | 0 refills | Status: DC

## 2020-08-06 NOTE — Progress Notes (Signed)
Subjective:    Patient ID: Amber Garrett, female    DOB: 10-12-1940, 80 y.o.   MRN: [id number]  HPI  80 year old Female for Medicare wellness and health maintenance exam and evaluation of medical issues.  In February we sent referral for hearing test at her request.  She has a longstanding history of insomnia and has taken both Lunesta and Ambien.  In 2018, she struck a mailbox while taking Ambien for sleep and at that point was changed to [facility] but in April 2021 she requested to be switched back to Ambien.  In August 2021 she requested to be changed from Ambien to [place].  Apparently she reported she had spent some time with her daughter and  was up one night eating granola bars and did not recall it at all while she was taking Ambien.  Amnesia is not unusual with Ambien.  At that point we called in Lunesta 2 mg to take nightly with refills.  She has a history of osteopenia but bone density study in 2021 improved from -2.4 to -1.9 and the right hip.  We requested that she continue with vitamin D supplement and repeat study in 2 years.  She has a history of mild hypertension treated with lisinopril 5 mg daily.  History of irritable bowel symptoms treated with as needed Levsin.  Has a history of allergic rhinitis, hearing loss and wears hearing aids.  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  History of osteoarthritis, history of sciatica and osteopenia.  Past medical history: Possibly allergic to Macrodantin because it causes rash on arms only.  History of C-section and incisional hernia with appendectomy 1973.  C-section in [place] in 1970.  Colonoscopy done in 2007 by Dr. Cristina Gong showing only diverticulosis.  Had cataract extraction by Dr. Bing Plume August 2014 in October 2014.  History of echocardiogram in 2006 to evaluate murmur.  She has moderate tricuspid regurgitation and elevation of right ventricular systolic pressure 30 to 40 mm consistent with mild pulmonary  hypertension.  Mitral valve leaflets were thickened but open well without stenosis.  Trace mitral regurgitation.  She is asymptomatic.  Social history: She has a Gaffer.  Does not smoke.  Social alcohol consumption.  Exercises regularly enjoys traveling.  She is a widow.  Husband passed away at [facility] with history of Multiple sclerosis.  Family history: Father died at age 31 with pulmonary fibrosis.  Mother died with history of hypertension, heart issues, bradycardia, C. difficile and MRSA.  1 brother in good health.  Son and daughter in good health.  She had issues with tear of the medial meniscus right knee in 2019.  She saw Dr. Micheline Chapman for right knee pain and was diagnosed with torn posterior horn of the medial meniscus and subchondral fracture of the medial tibial plateau by MRI.  Knee was injected.  She was not aware of any trauma causing this.      Review of Systems weight stable.  Has actually lost 2 pounds since last year.  Otherwise no new complaints.     Objective:   Physical Exam  Blood pressure 130/80, pulse 86 regular, pulse oximetry 97%, weight 150 pounds ,height 5 feet 3 inches, BMI 26.57  Skin is warm and dry.  Nodes none.  TMs are clear.  Neck is supple without JVD thyromegaly or carotid bruits.  Chest is clear to auscultation.  Cardiac exam regular rate and rhythm normal S1 and S2 1/6 systolic ejection murmur.  Abdomen  is soft nondistended without hepatosplenomegaly, masses or tenderness.  Pap deferred due to age.  Bimanual is normal.  No lower extremity edema.  Neuro exam is intact without focal deficits.  Her affect thought and judgment are within normal limits.     Assessment & Plan:  Longstanding history of insomnia now treated with Lunesta.  Mild hypertension treated with low-dose lisinopril and stable  History of cardiac murmur.  Had 2D echocardiogram in 2006 and was found to have moderate tricuspid retards rotation and mild pulmonary hypertension.   She is asymptomatic with no chest pain or significant shortness of breath.  History of osteopenia.  Continue to monitor.  Last T score was -1.9 and 2021 and had improved from 2019.  Needs repeat bone density study in 2023.  Currently not on bone sparing medication.  Hearing loss-wears hearing aids.  Hearing referral made.  History of allergic rhinitis.  History of positive PPD in the remote past with negative chest x-ray.  History of anxiety treated sparingly with benzodiazepines.  Remote history of sciatica.  History of irritable bowel syndrome treated with Levsin but this is improved  History of right torn medial meniscus 2019 treated conservatively.  Pure hypercholesterolemia.  In 2021 total cholesterol was 219 and is now 239.  LDL cholesterol has increased from 1 26-1 37.  She is starting Crestor 5 mg with supper 3 times a week with follow-up in 6 months with fasting lipid panel and liver functions.  Subjective:   Patient presents for Medicare Annual/Subsequent preventive examination.  Review Past Medical/Family/Social: See above   Risk Factors  Current exercise habits: Exercises regularly Dietary issues discussed: Low-fat low carbohydrate  Cardiac risk factors: Hyperlipidemia  Depression Screen  (Note: if answer to either of the following is "Yes", a more complete depression screening is indicated)   Over the past two weeks, have you felt down, depressed or hopeless? No  Over the past two weeks, have you felt little interest or pleasure in doing things? No Have you lost interest or pleasure in daily life? No Do you often feel hopeless? No Do you cry easily over simple problems? No   Activities of Daily Living  In your present state of health, do you have any difficulty performing the following activities?:   Driving? No  Managing money? No  Feeding yourself? No  Getting from bed to chair? No  Climbing a flight of stairs? No  Preparing food and eating?: No   Bathing or showering? No  Getting dressed: No  Getting to the toilet? No  Using the toilet:No  Moving around from place to place: No  In the past year have you fallen or had a near fall?:No  Are you sexually active? No  Do you have more than one partner? No   Hearing Difficulties: Yes wears bilateral hearing aids Do you often ask people to speak up or repeat themselves?  Yes Do you experience ringing or noises in your ears? No  Do you have difficulty understanding soft or whispered voices?  Yes Do you feel that you have a problem with memory? No Do you often misplace items? No    Home Safety:  Do you have a smoke alarm at your residence? Yes Do you have grab bars in the bathroom?  No Do you have throw rugs in your house?  No   Cognitive Testing  Alert? Yes Normal Appearance?Yes  Oriented to person? Yes Place? Yes  Time? Yes  Recall of three objects? Yes  Can  perform simple calculations? Yes  Displays appropriate judgment?Yes  Can read the correct time from a watch face?Yes   List the Names of Other Physician/Practitioners you currently use:  See referral list for the physicians patient is currently seeing.     Review of Systems: See above   Objective:     General appearance: Appears younger than stated age Head: Normocephalic, without obvious abnormality, atraumatic  Eyes: conj clear, EOMi PEERLA  Ears: normal TM's and external ear canals both ears  Nose: Nares normal. Septum midline. Mucosa normal. No drainage or sinus tenderness.  Throat: lips, mucosa, and tongue normal; teeth and gums normal  Neck: no adenopathy, no carotid bruit, no JVD, supple, symmetrical, trachea midline and thyroid not enlarged, symmetric, no tenderness/mass/nodules  No CVA tenderness.  Lungs: clear to auscultation bilaterally  Breasts: normal appearance, no masses or tenderness Heart: regular rate and rhythm, S1, S2 normal, no murmur, click, rub or gallop  Abdomen: soft, non-tender;  bowel sounds normal; no masses, no organomegaly  Musculoskeletal: ROM normal in all joints, no crepitus, no deformity, Normal muscle strengthen. Back  is symmetric, no curvature. Skin: Skin color, texture, turgor normal. No rashes or lesions  Lymph nodes: Cervical, supraclavicular, and axillary nodes normal.  Neurologic: CN 2 -12 Normal, Normal symmetric reflexes. Normal coordination and gait  Psych: Alert & Oriented x 3, Mood appear stable.    Assessment:    Annual wellness medicare exam   Plan:    During the course of the visit the patient was educated and counseled about appropriate screening and preventive services including:   Has had 3 COVID vaccines and should consider booster  Recommend annual flu vaccine  Had pneumococcal 23 vaccine in 2009  Consider Shingrix vaccine  Colonoscopy done 2017 by Dr. Cristina Gong with findings only of diverticulosis.   Patient Instructions (the written plan) was given to the patient.  Medicare Attestation  I have personally reviewed:  The patient's medical and social history  Their use of alcohol, tobacco or illicit drugs  Their current medications and supplements  The patient's functional ability including ADLs,fall risks, home safety risks, cognitive, and hearing and visual impairment  Diet and physical activities  Evidence for depression or mood disorders  The patient's weight, height, BMI, and visual acuity have been recorded in the chart. I have made referrals, counseling, and provided education to the patient based on review of the above and I have provided the patient with a written personalized care plan for preventive services.

## 2020-09-23 ENCOUNTER — Encounter: Payer: Self-pay | Admitting: Internal Medicine

## 2020-09-23 NOTE — Patient Instructions (Signed)
We are placing you on Crestor for hyperlipidemia.  Please follow-up with fasting labs and office visit in 6 months.  Please take this 3 times a week with supper.  Continue other medications as previously prescribed.  It was a pleasure to see you today.

## 2020-10-23 ENCOUNTER — Other Ambulatory Visit: Payer: Self-pay | Admitting: Internal Medicine

## 2020-10-25 DIAGNOSIS — D2239 Melanocytic nevi of other parts of face: Secondary | ICD-10-CM | POA: Diagnosis not present

## 2020-10-25 DIAGNOSIS — L570 Actinic keratosis: Secondary | ICD-10-CM | POA: Diagnosis not present

## 2020-10-25 DIAGNOSIS — L718 Other rosacea: Secondary | ICD-10-CM | POA: Diagnosis not present

## 2020-10-25 DIAGNOSIS — D225 Melanocytic nevi of trunk: Secondary | ICD-10-CM | POA: Diagnosis not present

## 2020-10-25 DIAGNOSIS — L821 Other seborrheic keratosis: Secondary | ICD-10-CM | POA: Diagnosis not present

## 2020-10-25 DIAGNOSIS — L814 Other melanin hyperpigmentation: Secondary | ICD-10-CM | POA: Diagnosis not present

## 2020-12-11 DIAGNOSIS — Z1231 Encounter for screening mammogram for malignant neoplasm of breast: Secondary | ICD-10-CM | POA: Diagnosis not present

## 2020-12-11 LAB — HM MAMMOGRAPHY

## 2020-12-14 ENCOUNTER — Encounter: Payer: Self-pay | Admitting: Internal Medicine

## 2021-02-04 ENCOUNTER — Other Ambulatory Visit: Payer: Self-pay

## 2021-02-04 MED ORDER — ROSUVASTATIN CALCIUM 5 MG PO TABS: ORAL_TABLET | ORAL | 1 refills | Status: DC

## 2021-02-05 ENCOUNTER — Other Ambulatory Visit: Payer: Self-pay

## 2021-02-05 ENCOUNTER — Other Ambulatory Visit: Payer: Medicare Other | Admitting: Internal Medicine

## 2021-02-05 DIAGNOSIS — I10 Essential (primary) hypertension: Secondary | ICD-10-CM

## 2021-02-05 DIAGNOSIS — E7800 Pure hypercholesterolemia, unspecified: Secondary | ICD-10-CM

## 2021-02-06 LAB — LIPID PANEL
Cholesterol: 183 mg/dL (ref <200)
HDL: 78 mg/dL (ref >=50)
LDL Cholesterol (Calc): 85 mg/dL (calc)
Non-HDL Cholesterol (Calc): 105 mg/dL (calc) (ref <130)
Total CHOL/HDL Ratio: 2.3 (calc) (ref <5.0)
Triglycerides: 107 mg/dL (ref <150)

## 2021-02-06 LAB — HEPATIC FUNCTION PANEL
AG Ratio: 1.5 (calc) (ref 1.0–2.5)
ALT: 12 U/L (ref 6–29)
AST: 17 U/L (ref 10–35)
Albumin: 4.3 g/dL (ref 3.6–5.1)
Alkaline phosphatase (APISO): 63 U/L (ref 37–153)
Bilirubin, Direct: 0.2 mg/dL (ref 0.0–0.2)
Globulin: 2.8 g/dL (calc) (ref 1.9–3.7)
Indirect Bilirubin: 0.5 mg/dL (calc) (ref 0.2–1.2)
Total Bilirubin: 0.7 mg/dL (ref 0.2–1.2)
Total Protein: 7.1 g/dL (ref 6.1–8.1)

## 2021-02-07 ENCOUNTER — Encounter: Payer: Self-pay | Admitting: Internal Medicine

## 2021-02-07 ENCOUNTER — Other Ambulatory Visit: Payer: Self-pay

## 2021-02-07 ENCOUNTER — Ambulatory Visit (INDEPENDENT_AMBULATORY_CARE_PROVIDER_SITE_OTHER): Payer: Medicare Other | Admitting: Internal Medicine

## 2021-02-07 VITALS — BP 130/84 | HR 74 | Temp 98.0°F | Ht 63.0 in | Wt 150.0 lb

## 2021-02-07 DIAGNOSIS — Z87898 Personal history of other specified conditions: Secondary | ICD-10-CM | POA: Diagnosis not present

## 2021-02-07 DIAGNOSIS — H903 Sensorineural hearing loss, bilateral: Secondary | ICD-10-CM

## 2021-02-07 DIAGNOSIS — I10 Essential (primary) hypertension: Secondary | ICD-10-CM

## 2021-02-07 DIAGNOSIS — I270 Primary pulmonary hypertension: Secondary | ICD-10-CM

## 2021-02-07 DIAGNOSIS — M8580 Other specified disorders of bone density and structure, unspecified site: Secondary | ICD-10-CM

## 2021-02-07 DIAGNOSIS — E7800 Pure hypercholesterolemia, unspecified: Secondary | ICD-10-CM | POA: Diagnosis not present

## 2021-02-07 DIAGNOSIS — Z23 Encounter for immunization: Secondary | ICD-10-CM | POA: Diagnosis not present

## 2021-02-07 MED ORDER — ROSUVASTATIN CALCIUM 5 MG PO TABS: ORAL_TABLET | ORAL | 3 refills | Status: DC

## 2021-02-07 NOTE — Patient Instructions (Signed)
It was a pleasure to see you today.  Continue current medications and follow-up in April for Medicare wellness and health maintenance exam.

## 2021-02-07 NOTE — Progress Notes (Signed)
   Subjective:    Patient ID: MILLEY VINING, female    DOB: 11/10/1940, 80 y.o.   MRN: [id number]  HPI 80 year old Female in today for 12-month recheck.  Has history of longstanding insomnia.  History of mild hypertension treated with lisinopril.  History of pulmonary hypertension noted on echocardiogram in 2006.  History of moderate tricuspid regurgitation and elevation of right ventricular systolic pressure 30 to 40 mm consistent with mild pulmonary hypertension.  Mitral valve leaflets were thickened but open well without stenosis.  She is asymptomatic.  History of hearing loss and wears hearing aids.  History of allergic rhinitis.  History of irritable bowel symptoms treated with as needed Levsin.  History of pure hypercholesterolemia.  Is on Crestor 5 mg 3 times a week and recent lipid panel and liver functions were entirely normal.  History of chronic insomnia currently treated with Lunesta.    Review of Systems no new complaints     Objective:   Physical Exam Blood pressure 130/84 pulse 74 temperature 98 degrees pulse oximetry 98% BMI 26.57 Skin warm and dry.  No thyromegaly, cervical adenopathy, or bruits. Chest is clear. Cor: RRR without ectopy. No LE edema.      Assessment & Plan:  History of insomnia treated with Lunesta  Essential HTN- stable on current regimen  Hx of pulmonary HTN- asymptomatic  Pure hypercholesterolemia- stable on statin  Plan: Continue current meds. RTC in 6 months for CPE and annual Medicare Wellness visit

## 2021-02-18 ENCOUNTER — Other Ambulatory Visit: Payer: Self-pay | Admitting: Internal Medicine

## 2021-03-08 DIAGNOSIS — Z23 Encounter for immunization: Secondary | ICD-10-CM | POA: Diagnosis not present

## 2021-03-26 DIAGNOSIS — H04123 Dry eye syndrome of bilateral lacrimal glands: Secondary | ICD-10-CM | POA: Diagnosis not present

## 2021-03-26 DIAGNOSIS — H52223 Regular astigmatism, bilateral: Secondary | ICD-10-CM | POA: Diagnosis not present

## 2021-03-26 DIAGNOSIS — H524 Presbyopia: Secondary | ICD-10-CM | POA: Diagnosis not present

## 2021-03-26 DIAGNOSIS — H43813 Vitreous degeneration, bilateral: Secondary | ICD-10-CM | POA: Diagnosis not present

## 2021-03-26 DIAGNOSIS — H26493 Other secondary cataract, bilateral: Secondary | ICD-10-CM | POA: Diagnosis not present

## 2021-04-12 DIAGNOSIS — M25551 Pain in right hip: Secondary | ICD-10-CM | POA: Diagnosis not present

## 2021-04-12 DIAGNOSIS — M545 Low back pain, unspecified: Secondary | ICD-10-CM | POA: Diagnosis not present

## 2021-04-16 DIAGNOSIS — M545 Low back pain, unspecified: Secondary | ICD-10-CM | POA: Diagnosis not present

## 2021-04-17 DIAGNOSIS — M545 Low back pain, unspecified: Secondary | ICD-10-CM | POA: Diagnosis not present

## 2021-04-23 DIAGNOSIS — M5416 Radiculopathy, lumbar region: Secondary | ICD-10-CM | POA: Diagnosis not present

## 2021-05-08 DIAGNOSIS — M5416 Radiculopathy, lumbar region: Secondary | ICD-10-CM | POA: Diagnosis not present

## 2021-05-14 DIAGNOSIS — M2578 Osteophyte, vertebrae: Secondary | ICD-10-CM | POA: Diagnosis not present

## 2021-05-14 DIAGNOSIS — M5416 Radiculopathy, lumbar region: Secondary | ICD-10-CM | POA: Diagnosis not present

## 2021-05-14 DIAGNOSIS — M5116 Intervertebral disc disorders with radiculopathy, lumbar region: Secondary | ICD-10-CM | POA: Diagnosis not present

## 2021-05-14 DIAGNOSIS — M48061 Spinal stenosis, lumbar region without neurogenic claudication: Secondary | ICD-10-CM | POA: Diagnosis not present

## 2021-06-05 DIAGNOSIS — M5416 Radiculopathy, lumbar region: Secondary | ICD-10-CM | POA: Diagnosis not present

## 2021-06-05 DIAGNOSIS — M4126 Other idiopathic scoliosis, lumbar region: Secondary | ICD-10-CM | POA: Diagnosis not present

## 2021-06-05 DIAGNOSIS — M545 Low back pain, unspecified: Secondary | ICD-10-CM | POA: Diagnosis not present

## 2021-06-11 DIAGNOSIS — M5416 Radiculopathy, lumbar region: Secondary | ICD-10-CM | POA: Diagnosis not present

## 2021-06-17 DIAGNOSIS — M5416 Radiculopathy, lumbar region: Secondary | ICD-10-CM | POA: Diagnosis not present

## 2021-06-24 DIAGNOSIS — M5416 Radiculopathy, lumbar region: Secondary | ICD-10-CM | POA: Diagnosis not present

## 2021-06-26 DIAGNOSIS — M5416 Radiculopathy, lumbar region: Secondary | ICD-10-CM | POA: Diagnosis not present

## 2021-07-01 DIAGNOSIS — M5416 Radiculopathy, lumbar region: Secondary | ICD-10-CM | POA: Diagnosis not present

## 2021-07-03 DIAGNOSIS — M5416 Radiculopathy, lumbar region: Secondary | ICD-10-CM | POA: Diagnosis not present

## 2021-07-05 ENCOUNTER — Other Ambulatory Visit: Payer: Self-pay | Admitting: Internal Medicine

## 2021-07-05 DIAGNOSIS — I10 Essential (primary) hypertension: Secondary | ICD-10-CM

## 2021-07-10 ENCOUNTER — Telehealth: Payer: Self-pay | Admitting: Internal Medicine

## 2021-07-10 NOTE — Telephone Encounter (Signed)
LVM and sent MY chart message that we need to change CPE to different time have offered 11:00 or 3:00 on same day. ?

## 2021-07-10 NOTE — Telephone Encounter (Signed)
Amber Garrett called back and she has a meeting at 11:00 am and would rather do the 3:00 appointment, she will do the 11;00 if other person can not. ?

## 2021-07-11 NOTE — Telephone Encounter (Signed)
Rescheduled to 3:00, patient aware ?

## 2021-07-12 DIAGNOSIS — M5416 Radiculopathy, lumbar region: Secondary | ICD-10-CM | POA: Diagnosis not present

## 2021-07-15 DIAGNOSIS — M5416 Radiculopathy, lumbar region: Secondary | ICD-10-CM | POA: Diagnosis not present

## 2021-07-17 DIAGNOSIS — M5416 Radiculopathy, lumbar region: Secondary | ICD-10-CM | POA: Diagnosis not present

## 2021-07-22 DIAGNOSIS — M5416 Radiculopathy, lumbar region: Secondary | ICD-10-CM | POA: Diagnosis not present

## 2021-07-24 DIAGNOSIS — M5416 Radiculopathy, lumbar region: Secondary | ICD-10-CM | POA: Diagnosis not present

## 2021-07-29 DIAGNOSIS — M5416 Radiculopathy, lumbar region: Secondary | ICD-10-CM | POA: Diagnosis not present

## 2021-07-31 DIAGNOSIS — M5416 Radiculopathy, lumbar region: Secondary | ICD-10-CM | POA: Diagnosis not present

## 2021-08-05 DIAGNOSIS — M5416 Radiculopathy, lumbar region: Secondary | ICD-10-CM | POA: Diagnosis not present

## 2021-08-06 ENCOUNTER — Other Ambulatory Visit: Payer: Medicare Other

## 2021-08-06 DIAGNOSIS — E7800 Pure hypercholesterolemia, unspecified: Secondary | ICD-10-CM

## 2021-08-06 DIAGNOSIS — I10 Essential (primary) hypertension: Secondary | ICD-10-CM | POA: Diagnosis not present

## 2021-08-06 DIAGNOSIS — R5383 Other fatigue: Secondary | ICD-10-CM

## 2021-08-07 DIAGNOSIS — M5416 Radiculopathy, lumbar region: Secondary | ICD-10-CM | POA: Diagnosis not present

## 2021-08-07 LAB — LIPID PANEL
Cholesterol: 193 mg/dL (ref <200)
HDL: 82 mg/dL (ref >=50)
LDL Cholesterol (Calc): 92 mg/dL (calc)
Non-HDL Cholesterol (Calc): 111 mg/dL (calc) (ref <130)
Total CHOL/HDL Ratio: 2.4 (calc) (ref <5.0)
Triglycerides: 98 mg/dL (ref <150)

## 2021-08-07 LAB — COMPLETE METABOLIC PANEL WITH GFR
AG Ratio: 1.5 (calc) (ref 1.0–2.5)
ALT: 14 U/L (ref 6–29)
AST: 20 U/L (ref 10–35)
Albumin: 4.3 g/dL (ref 3.6–5.1)
Alkaline phosphatase (APISO): 55 U/L (ref 37–153)
BUN: 22 mg/dL (ref 7–25)
CO2: 24 mmol/L (ref 20–32)
Calcium: 9.8 mg/dL (ref 8.6–10.4)
Chloride: 104 mmol/L (ref 98–110)
Creat: 0.93 mg/dL (ref 0.60–1.00)
Globulin: 2.8 g/dL (calc) (ref 1.9–3.7)
Glucose, Bld: 91 mg/dL (ref 65–99)
Potassium: 4.3 mmol/L (ref 3.5–5.3)
Sodium: 138 mmol/L (ref 135–146)
Total Bilirubin: 0.5 mg/dL (ref 0.2–1.2)
Total Protein: 7.1 g/dL (ref 6.1–8.1)
eGFR: 63 mL/min/{1.73_m2} (ref >=60)

## 2021-08-07 LAB — CBC WITH DIFFERENTIAL/PLATELET
Absolute Monocytes: 736 cells/uL (ref 200–950)
Basophils Absolute: 64 cells/uL (ref 0–200)
Basophils Relative: 0.8 %
Eosinophils Absolute: 280 cells/uL (ref 15–500)
Eosinophils Relative: 3.5 %
HCT: 42.7 % (ref 35.0–45.0)
Hemoglobin: 14.2 g/dL (ref 11.7–15.5)
Lymphs Abs: 3160 cells/uL (ref 850–3900)
MCH: 32.4 pg (ref 27.0–33.0)
MCHC: 33.3 g/dL (ref 32.0–36.0)
MCV: 97.5 fL (ref 80.0–100.0)
MPV: 10.7 fL (ref 7.5–12.5)
Monocytes Relative: 9.2 %
Neutro Abs: 3760 cells/uL (ref 1500–7800)
Neutrophils Relative %: 47 %
Platelets: 285 10*3/uL (ref 140–400)
RBC: 4.38 10*6/uL (ref 3.80–5.10)
RDW: 11.7 % (ref 11.0–15.0)
Total Lymphocyte: 39.5 %
WBC: 8 10*3/uL (ref 3.8–10.8)

## 2021-08-07 LAB — TSH: TSH: 2.47 mIU/L (ref 0.40–4.50)

## 2021-08-08 ENCOUNTER — Encounter: Payer: Self-pay | Admitting: Internal Medicine

## 2021-08-08 ENCOUNTER — Ambulatory Visit (INDEPENDENT_AMBULATORY_CARE_PROVIDER_SITE_OTHER): Payer: Medicare Other | Admitting: Internal Medicine

## 2021-08-08 VITALS — BP 112/80 | HR 84 | Temp 99.0°F | Ht 63.0 in | Wt 140.8 lb

## 2021-08-08 DIAGNOSIS — Z7184 Encounter for health counseling related to travel: Secondary | ICD-10-CM | POA: Diagnosis not present

## 2021-08-08 DIAGNOSIS — E7800 Pure hypercholesterolemia, unspecified: Secondary | ICD-10-CM | POA: Diagnosis not present

## 2021-08-08 DIAGNOSIS — K580 Irritable bowel syndrome with diarrhea: Secondary | ICD-10-CM

## 2021-08-08 DIAGNOSIS — I10 Essential (primary) hypertension: Secondary | ICD-10-CM

## 2021-08-08 DIAGNOSIS — I270 Primary pulmonary hypertension: Secondary | ICD-10-CM | POA: Diagnosis not present

## 2021-08-08 DIAGNOSIS — Z87898 Personal history of other specified conditions: Secondary | ICD-10-CM

## 2021-08-08 DIAGNOSIS — H903 Sensorineural hearing loss, bilateral: Secondary | ICD-10-CM

## 2021-08-08 DIAGNOSIS — Z0000 Encounter for general adult medical examination without abnormal findings: Secondary | ICD-10-CM

## 2021-08-08 DIAGNOSIS — M8580 Other specified disorders of bone density and structure, unspecified site: Secondary | ICD-10-CM

## 2021-08-08 LAB — POCT URINALYSIS DIPSTICK
Glucose, UA: NEGATIVE
Protein, UA: NEGATIVE

## 2021-08-08 MED ORDER — ESZOPICLONE 2 MG PO TABS: 2.0000 mg | ORAL_TABLET | Freq: Every evening | ORAL | 0 refills | Status: DC | PRN

## 2021-08-08 MED ORDER — AZITHROMYCIN 250 MG PO TABS: ORAL_TABLET | ORAL | 0 refills | Status: DC

## 2021-08-08 MED ORDER — ROSUVASTATIN CALCIUM 5 MG PO TABS: ORAL_TABLET | ORAL | 3 refills | Status: DC

## 2021-08-08 NOTE — Progress Notes (Signed)
Subjective:    Patient ID: Amber Garrett, female    DOB: May 17, 1940, 81 y.o.   MRN: [id number]  HPI 81 year old Female seen for health maintenance exam and evaluation of medical issues. Her Medicare AWV will performed on April 11.  Patient feels well with no new complaints. Has several trips planned with family this year.  She has a longstanding history of insomnia and currently is on Lunesta 2 mg at bedtime.  Was changed to [facility] in 2018 as she struck a mailbox while taking Ambien for sleep.  She requested being switched back to Ambien in April 2021.  In August 2021 she requested to be changed from Ambien to [place].  Apparently was eating granola bars 1 night and did not recall it at the time she was taken Ambien.  She is now on Lunesta 2 mg nightly.  History of osteopenia.  Bone density study in 2021 improved from -2.4 to -1.9 in the right hip.  She continues with vitamin D supplement.  Repeat study due this year.  This will be done at [facility].  Mammogram due in August 2023 at [place].  History of mild hypertension treated with lisinopril 5 mg daily.  History of allergic rhinitis, hearing loss and wears hearing aids, history of irritable bowel symptoms treated with Levsin as needed, history of osteoarthritis, history of sciatica.  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  Had cataract extraction by Dr. Bing Plume August 2014 and October 2014.  Past medical history: Possibly allergic to Macrodantin because it caused rash on arms only.  History of C-section and incisional hernia with appendectomy 1973.  C-section in [place] in 1970.  Colonoscopy done in 2007 by Dr. Cristina Gong showing only diverticulosis.  Had echocardiogram in 2006 to evaluate murmur.  Had moderate tricuspid regurgitation and elevation of right ventricular systolic pressure of 30 to 40 mm consistent with mild pulmonary hypertension.  Mitral valve leaflets were thickened but open well without stenosis.  Had trace mitral  regurgitation.  She is completely asymptomatic.  She had issues with tear of the medial meniscus right knee in 2019.  She saw Dr. Micheline Chapman for right knee pain and was diagnosed with a torn posterior horn of the medial meniscus and subchondral fracture of the medial tibial plateau by MRI.  Knee was injected.  She was not aware of any trauma causing this.  Social history: She has a Gaffer.  Does not smoke.  Social alcohol consumption.  Exercises regularly and enjoys traveling.  She is a widow.  Husband passed away [facility] with history of multiple sclerosis.  Family history: Father died at age 67 with pulmonary fibrosis.  Mother died with history of hypertension, heart issues, bradycardia, C. difficile and MRSA.  1 brother in good health.  Son and daughter in good health.           Review of Systems denies chest pain, shortness of breath, GI issues or significant musculoskeletal pain     Objective:   Physical Exam Vital signs reviewed.  Blood pressure excellent 112/80 BMI 24.94 weight 140 pounds 12.8 ounces pulse 84 and regular  Skin: Is warm and dry.  No cervical adenopathy.  No thyromegaly.  No carotid bruits.  No JVD is present.  Chest is clear to auscultation without rales or wheezing.  Cardiac exam: Regular rate and rhythm, normal S1 and S2.  There is a 1/6 systolic ejection murmur.  Abdomen: Soft, nondistended without hepatosplenomegaly, masses or tenderness.  Pap  is deferred due to age but bimanual exam is completely normal.  No lower extremity pitting edema.  Neurological exam is intact without gross focal deficits.  Affect, thought and judgment are normal.       Assessment & Plan:  Her labs are reviewed including CBC, TSH, lipid panel CBC and dipstick urine specimen all of which are completely normal.  Longstanding history of insomnia now treated with Lunesta.  Mild hypertension treated with low-dose lisinopril 5 mg daily and stable  History of cardiac murmur.   2D echocardiogram in 2006 showed moderate tricuspid regurgitation and mild pulmonary hypertension.  She is asymptomatic.  She has no significant shortness of breath or chest pain.  Pure hypercholesterolemia with dramatic response to low-dose Crestor 5 mg 3 times a week.  Lipids are now normal.  History of osteopenia.  T score -1.9 in 2021.  Due for repeat study later this year.  Hearing loss-wears hearing aids.  Allergic rhinitis  History of positive PPD in the remote past with negative chest x-ray.   Remote history of sciatica.  History of right torn medial meniscus 2019 treated conservatively  History of irritable bowel syndrome treated with as needed Levsin.

## 2021-08-12 DIAGNOSIS — M5416 Radiculopathy, lumbar region: Secondary | ICD-10-CM | POA: Diagnosis not present

## 2021-08-13 ENCOUNTER — Ambulatory Visit (INDEPENDENT_AMBULATORY_CARE_PROVIDER_SITE_OTHER): Payer: Medicare Other | Admitting: Internal Medicine

## 2021-08-13 ENCOUNTER — Encounter: Payer: Self-pay | Admitting: Internal Medicine

## 2021-08-13 DIAGNOSIS — Z0000 Encounter for general adult medical examination without abnormal findings: Secondary | ICD-10-CM

## 2021-08-13 NOTE — Progress Notes (Signed)
?I connected with  Amber Garrett on 08/13/21 by a audio enabled telemedicine application and verified that I am speaking with the correct person using two identifiers. ? ?Patient Location: Home ? ?Provider Location: Office/Clinic ? ?I discussed the limitations of evaluation and management by telemedicine. The patient expressed understanding and agreed to proceed.  ?Subjective:  ? Amber Garrett is a 81 y.o. female who presents for Medicare Annual (Subsequent) preventive examination. ? ?Review of Systems    ?Defer to PCP ?Cardiac Risk Factors include: advanced age (>33mn, >>42women) ? ?   ?Objective:  ?  ?There were no vitals filed for this visit. ?There is no height or weight on file to calculate BMI. ? ? ?  08/13/2021  ?  1:04 PM 09/15/2019  ?  5:08 PM 06/25/2016  ?  3:11 PM 06/23/2016  ?  1:33 PM 06/16/2016  ?  1:00 PM 05/19/2016  ? 10:14 AM  ?Advanced Directives  ?Does Patient Have a Medical Advance Directive? Yes Yes Yes Yes No Yes  ?Type of AParamedicof APlessisLiving will HSimpsonLiving will HTangierLiving will HFriantLiving will  HExlineLiving will  ?Does patient want to make changes to medical advance directive? No - Patient declined No - Patient declined      ?Copy of [place]? No - copy requested No - copy requested No - copy requested     ?Would patient like information on creating a medical advance directive?     No - Patient declined   ? ? ?Current Medications (verified) ?Outpatient Encounter Medications as of 08/13/2021  ?Medication Sig  ? azithromycin (ZITHROMAX) 250 MG tablet Take 2 tablets on day 1, then 1 tablet daily on days 2 through 5  ? b complex vitamins tablet Take 1 tablet by mouth daily.  ? calcium carbonate (OSCAL) 1500 (600 Ca) MG TABS tablet Take 1,000 mg by mouth 2 (two) times daily with a meal.   ? cholecalciferol (VITAMIN D) 1000 units tablet  Take 5,000 Units by mouth daily.   ? eszopiclone (LUNESTA) 2 MG TABS tablet Take 1 tablet (2 mg total) by mouth at bedtime as needed for sleep. Take immediately before bedtime  ? hyoscyamine (LEVSIN, ANASPAZ) 0.125 MG tablet One po ac and hs prn for irritable bowel symptoms  ? lisinopril (ZESTRIL) 5 MG tablet TAKE ONE TABLET EACH DAY  ? rosuvastatin (CRESTOR) 5 MG tablet TAKE ONE TABLET THREE TIMES A WEEK AT SUPPER  ? ?No facility-administered encounter medications on file as of 08/13/2021.  ? ? ?Allergies (verified) ?Macrodantin  ? ?History: ?Past Medical History:  ?Diagnosis Date  ? HTN (hypertension)   ? Hx: UTI (urinary tract infection)   ? OA (osteoarthritis)   ? Positive PPD   ? Sciatica   ? Vaginal atrophy   ? ?Past Surgical History:  ?Procedure Laterality Date  ? APPENDECTOMY    ? CESAREAN SECTION    ? ENDOMETRIAL ABLATION    ? INGUINAL HERNIA REPAIR    ? ?Family History  ?Problem Relation Age of Onset  ? Depression Father   ? ?Social History  ? ?Socioeconomic History  ? Marital status: Widowed  ?  Spouse name: Not on file  ? Number of children: Not on file  ? Years of education: Not on file  ? Highest education level: Not on file  ?Occupational History  ? Not on file  ?Tobacco Use  ?  Smoking status: Never  ? Smokeless tobacco: Never  ?Substance and Sexual Activity  ? Alcohol use: Yes  ? Drug use: No  ? Sexual activity: Not on file  ?Other Topics Concern  ? Not on file  ?Social History Narrative  ? Not on file  ? ?Social Determinants of Health  ? ?Financial Resource Strain: Low Risk   ? Difficulty of Paying Living Expenses: Not hard at all  ?Food Insecurity: No Food Insecurity  ? Worried About Charity fundraiser in the Last Year: Never true  ? Ran Out of Food in the Last Year: Never true  ?Transportation Needs: No Transportation Needs  ? Lack of Transportation (Medical): No  ? Lack of Transportation (Non-Medical): No  ?Physical Activity: Not on file  ?Stress: No Stress Concern Present  ? Feeling of Stress :  Not at all  ?Social Connections: Moderately Integrated  ? Frequency of Communication with Friends and Family: More than three times a week  ? Frequency of Social Gatherings with Friends and Family: More than three times a week  ? Attends Religious Services: More than 4 times per year  ? Active Member of Clubs or Organizations: Yes  ? Attends Archivist Meetings: More than 4 times per year  ? Marital Status: Widowed  ? ? ?Tobacco Counseling ?Counseling given: Not Answered ? ? ?Clinical Intake: ? ?Pre-visit preparation completed: Yes ? ?Pain : No/denies pain ? ?  ? ?Nutritional Status: BMI of 19-24  Normal ?Nutritional Risks: None ?Diabetes: No ? ?How often do you need to have someone help you when you read instructions, pamphlets, or other written materials from your doctor or pharmacy?: 1 - Never ? ?Diabetic?No ? ?Interpreter Needed?: No ? ?  ? ? ?Activities of Daily Living ? ?  08/13/2021  ?  1:05 PM  ?In your present state of health, do you have any difficulty performing the following activities:  ?Hearing? 0  ?Vision? 0  ?Difficulty concentrating or making decisions? 0  ?Walking or climbing stairs? 0  ?Dressing or bathing? 0  ?Doing errands, shopping? 0  ?Preparing Food and eating ? N  ?Using the Toilet? N  ?In the past six months, have you accidently leaked urine? N  ?Do you have problems with loss of bowel control? N  ?Managing your Medications? N  ?Managing your Finances? N  ?Housekeeping or managing your Housekeeping? N  ? ? ?Patient Care Team: ?Elby Showers, MD as PCP - General (Internal Medicine) ? ?Indicate any recent Medical Services you may have received from other than [facility] providers in the past year (date may be approximate). ? ?   ?Assessment:  ? This is a routine wellness examination for [facility]. ? ?Hearing/Vision screen ?No results found. ? ?Dietary issues and exercise activities discussed: ?Current Exercise Habits: Home exercise routine, Type of exercise: walking, Time (Minutes): 60,  Frequency (Times/Week): 7, Weekly Exercise (Minutes/Week): 420, Intensity: Mild, Exercise limited by: orthopedic condition(s) ? ? Goals Addressed   ?None ?  ?Depression Screen ? ?  08/13/2021  ?  1:05 PM 08/06/2020  ?  2:11 PM 08/04/2019  ? 11:13 AM 06/24/2018  ? 10:17 AM 06/16/2017  ? 11:10 AM 05/19/2016  ? 10:15 AM 11/16/2014  ?  2:10 PM  ?PHQ 2/9 Scores  ?PHQ - 2 Score 0 0 0 0 0 0 0  ?  ?Fall Risk ? ?  08/13/2021  ?  1:05 PM 08/06/2020  ?  2:11 PM 08/04/2019  ? 11:13 AM 06/24/2018  ?  10:17 AM 06/16/2017  ? 11:10 AM  ?Fall Risk   ?Falls in the past year? 0 0 0 0 No  ?Number falls in past yr: 0 0 0    ?Injury with Fall? 0 0     ?Risk for fall due to : No Fall Risks  No Fall Risks    ?Follow up Falls evaluation completed Falls evaluation completed Falls evaluation completed;Education provided;Falls prevention discussed;Follow up appointment Falls prevention discussed   ? ? ?FALL RISK PREVENTION PERTAINING TO THE HOME: ? ?Any stairs in or around the home? Yes  ?If so, are there any without handrails? Yes  ?Home free of loose throw rugs in walkways, pet beds, electrical cords, etc? No  ?Adequate lighting in your home to reduce risk of falls? Yes  ? ?ASSISTIVE DEVICES UTILIZED TO PREVENT FALLS: ? ?Life alert? No  ?Use of a cane, walker or w/c? No  ?Grab bars in the bathroom? Yes  ?Shower chair or bench in shower? No  ?Elevated toilet seat or a handicapped toilet? No  ? ?TIMED UP AND GO: ? ?Was the test performed? No .  ?Length of time to ambulate 10 feet: na sec.  ? ? ? ?Cognitive Function: ?  ?  ? ?  08/13/2021  ?  1:06 PM  ?6CIT Screen  ?What Year? 0 points  ?What month? 0 points  ?What time? 0 points  ?Count back from 20 0 points  ?Months in reverse 0 points  ? ? ?Immunizations ?Immunization History  ?Administered Date(s) Administered  ? Fluad Quad(high Dose 65+) 01/15/2019  ? Influenza Split 02/08/2013  ? Influenza,inj,Quad PF,6+ Mos 03/22/2015, 02/25/2016, 02/22/2018, 01/15/2019  ? Influenza-Unspecified 02/24/2014, 03/08/2021  ?  Moderna Sars-Covid-2 Vaccination 05/18/2019, 06/15/2019, 01/11/2020  ? PFIZER(Purple Top)SARS-COV-2 Vaccination 02/06/2021  ? Pneumococcal Polysaccharide-23 03/10/2008  ? ? ?TDAP status: Due, Education ha

## 2021-08-13 NOTE — Patient Instructions (Signed)

## 2021-09-19 ENCOUNTER — Telehealth: Payer: Self-pay | Admitting: Internal Medicine

## 2021-09-19 MED ORDER — AZITHROMYCIN 250 MG PO TABS: ORAL_TABLET | ORAL | 0 refills | Status: AC

## 2021-09-19 NOTE — Telephone Encounter (Signed)
Amber Garrett [phone]  Peggy called to say she is going back to [place] next week and she gave the antibiotic you had gave her for her last trip to someone else. Does she need another one for this trip?

## 2021-09-19 NOTE — Telephone Encounter (Signed)
Refill sent, pt notified

## 2021-09-30 NOTE — Patient Instructions (Addendum)
It was a pleasure to see you today.  Lab work is entirely within normal limits.  Continue low-dose Crestor and Lunesta.  Continue lisinopril 5 mg daily for mild hypertension.  May take Levsin if needed for irritable bowel symptoms.  Return in 1 year or as needed.  Mammogram and bone density study will be coming up in the near future.  We will perform Medicare wellness visit at a later date.  Return in 1 year or as needed.  For your trip to [place] please take Zithromax Z-PAK with you.  Lunesta refilled.

## 2021-10-15 ENCOUNTER — Ambulatory Visit (INDEPENDENT_AMBULATORY_CARE_PROVIDER_SITE_OTHER): Payer: Medicare Other | Admitting: Internal Medicine

## 2021-10-15 ENCOUNTER — Telehealth: Payer: Self-pay | Admitting: Internal Medicine

## 2021-10-15 VITALS — BP 128/90 | HR 70 | Temp 96.6°F | Wt 155.8 lb

## 2021-10-15 DIAGNOSIS — T63481A Toxic effect of venom of other arthropod, accidental (unintentional), initial encounter: Secondary | ICD-10-CM

## 2021-10-15 MED ORDER — CEFPROZIL 250 MG PO TABS
250.0000 mg | ORAL_TABLET | Freq: Two times a day (BID) | ORAL | 0 refills | Status: DC
Start: 2021-10-15 — End: 2022-08-20

## 2021-10-15 NOTE — Telephone Encounter (Signed)
Amber Garrett called back she is good for now, she will call back if she needs anything

## 2021-10-15 NOTE — Telephone Encounter (Signed)
LVM with this information, and for patient to CB if she needs [place].

## 2021-10-15 NOTE — Progress Notes (Signed)
   Subjective:    Patient ID: Amber Garrett, female    DOB: 07/21/1940, 81 y.o.   MRN: [id number]  HPI 81 year old Female had 3 yellowjacket stings while working outside with plants this morning.  Initially thought that these areas of stings more minor but as the day wore on, she noticed increasing erythema of her right upper extremity, swelling and pain.  She is here for evaluation.  Past medical history includes osteopenia, hypertension, insomnia, allergic rhinitis, hearing loss requiring hearing aids, irritable bowel syndrome, osteoarthritis and history of sciatica.  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  History of bilateral cataract extractions by Dr. Bing Plume in 2014.  Review of Systems currently afebrile, alert and oriented     Objective:   Physical Exam   BP 128/90 left arm pulse 70 T 98.6 degrees pulse ox 96% room air Weight 155 pounds 12.6 oz, BMI 27.60 Right forearm: Swollen and erythematous without vesicles.  Obvious insect stings noted with surrounding erythema.  Able to use her arm without difficulty.     Assessment & Plan:  Insect stings x3 right forearm  Plan: Cefzil 250 mg twice daily for 7 days for possible secondary bacterial infection/cellulitis.

## 2021-10-15 NOTE — Telephone Encounter (Signed)
After speaking with Dr Renold Genta she said to let patient know that places were going to swell, itch and get red, to put some Cortizone cream on it and she would be happy to see her if she would like.

## 2021-10-15 NOTE — Telephone Encounter (Signed)
Amber Garrett [phone]  Amber Garrett was stung by 3 yellow jackets this morning on the right arm between her elbow and wrist. She took one stringer out, wash it with soapy water, is elevating it icing it and took a antihistamine.

## 2021-10-19 ENCOUNTER — Encounter: Payer: Self-pay | Admitting: Internal Medicine

## 2021-10-19 NOTE — Patient Instructions (Addendum)
Cefzil 250 mg twice daily for 5 days for possible secondary bacterial infection of multiple insect stings right arm with cellulitis.  Patient declined steroid prescription.  Patient will monitor symptoms and call if needed.

## 2021-10-28 DIAGNOSIS — B353 Tinea pedis: Secondary | ICD-10-CM | POA: Diagnosis not present

## 2021-10-28 DIAGNOSIS — D225 Melanocytic nevi of trunk: Secondary | ICD-10-CM | POA: Diagnosis not present

## 2021-10-28 DIAGNOSIS — L720 Epidermal cyst: Secondary | ICD-10-CM | POA: Diagnosis not present

## 2021-10-28 DIAGNOSIS — L814 Other melanin hyperpigmentation: Secondary | ICD-10-CM | POA: Diagnosis not present

## 2021-10-28 DIAGNOSIS — D2239 Melanocytic nevi of other parts of face: Secondary | ICD-10-CM | POA: Diagnosis not present

## 2021-10-28 DIAGNOSIS — L821 Other seborrheic keratosis: Secondary | ICD-10-CM | POA: Diagnosis not present

## 2021-11-15 ENCOUNTER — Telehealth: Payer: Self-pay

## 2021-11-15 ENCOUNTER — Other Ambulatory Visit: Payer: Self-pay | Admitting: Internal Medicine

## 2021-11-15 NOTE — Telephone Encounter (Signed)
Order written for PT at [facility] as requested by patient for lumbar disc

## 2021-11-15 NOTE — Telephone Encounter (Signed)
Dr Renold Genta gave it to Mariann Laster to take care of.

## 2021-11-15 NOTE — Telephone Encounter (Signed)
Patient called asking for referral to PT at [facility] for her herniated disc. She states that you told her at her physical if she needed it you would send it for her. Please advise.

## 2021-12-05 LAB — HM DEXA SCAN

## 2021-12-17 ENCOUNTER — Encounter: Payer: Self-pay | Admitting: Internal Medicine

## 2021-12-17 DIAGNOSIS — Z1231 Encounter for screening mammogram for malignant neoplasm of breast: Secondary | ICD-10-CM | POA: Diagnosis not present

## 2021-12-17 LAB — HM MAMMOGRAPHY

## 2021-12-24 DIAGNOSIS — M8589 Other specified disorders of bone density and structure, multiple sites: Secondary | ICD-10-CM | POA: Diagnosis not present

## 2021-12-24 DIAGNOSIS — M810 Age-related osteoporosis without current pathological fracture: Secondary | ICD-10-CM | POA: Diagnosis not present

## 2021-12-24 DIAGNOSIS — Z780 Asymptomatic menopausal state: Secondary | ICD-10-CM | POA: Diagnosis not present

## 2021-12-25 ENCOUNTER — Encounter: Payer: Self-pay | Admitting: Internal Medicine

## 2022-01-13 DIAGNOSIS — M5416 Radiculopathy, lumbar region: Secondary | ICD-10-CM | POA: Diagnosis not present

## 2022-02-09 DIAGNOSIS — Z23 Encounter for immunization: Secondary | ICD-10-CM | POA: Diagnosis not present

## 2022-03-06 DIAGNOSIS — Z23 Encounter for immunization: Secondary | ICD-10-CM | POA: Diagnosis not present

## 2022-04-01 DIAGNOSIS — H43813 Vitreous degeneration, bilateral: Secondary | ICD-10-CM | POA: Diagnosis not present

## 2022-04-01 DIAGNOSIS — H04123 Dry eye syndrome of bilateral lacrimal glands: Secondary | ICD-10-CM | POA: Diagnosis not present

## 2022-04-01 DIAGNOSIS — H52223 Regular astigmatism, bilateral: Secondary | ICD-10-CM | POA: Diagnosis not present

## 2022-04-01 DIAGNOSIS — H35033 Hypertensive retinopathy, bilateral: Secondary | ICD-10-CM | POA: Diagnosis not present

## 2022-04-01 DIAGNOSIS — H26493 Other secondary cataract, bilateral: Secondary | ICD-10-CM | POA: Diagnosis not present

## 2022-04-01 DIAGNOSIS — H524 Presbyopia: Secondary | ICD-10-CM | POA: Diagnosis not present

## 2022-05-14 ENCOUNTER — Other Ambulatory Visit: Payer: Self-pay | Admitting: Internal Medicine

## 2022-07-02 ENCOUNTER — Other Ambulatory Visit: Payer: Self-pay | Admitting: Internal Medicine

## 2022-07-02 DIAGNOSIS — I10 Essential (primary) hypertension: Secondary | ICD-10-CM

## 2022-08-12 ENCOUNTER — Other Ambulatory Visit: Payer: Medicare Other

## 2022-08-12 DIAGNOSIS — R5383 Other fatigue: Secondary | ICD-10-CM | POA: Diagnosis not present

## 2022-08-12 DIAGNOSIS — E7800 Pure hypercholesterolemia, unspecified: Secondary | ICD-10-CM | POA: Diagnosis not present

## 2022-08-12 DIAGNOSIS — I10 Essential (primary) hypertension: Secondary | ICD-10-CM | POA: Diagnosis not present

## 2022-08-12 LAB — CBC WITH DIFFERENTIAL/PLATELET
Absolute Monocytes: 630 cells/uL (ref 200–950)
Lymphs Abs: 2251 cells/uL (ref 850–3900)
MCHC: 33.2 g/dL (ref 32.0–36.0)
Neutro Abs: 3618 cells/uL (ref 1500–7800)
Neutrophils Relative %: 54 %
RBC: 4.44 10*6/uL (ref 3.80–5.10)

## 2022-08-12 NOTE — Progress Notes (Shared)
Annual Wellness Visit    Patient Care Team: Margaree MackintoshBaxley, Sara Selvidge J, MD as PCP - General (Internal Medicine)  Visit Date: 08/19/22   Chief Complaint  Patient presents with   Medicare Wellness    Subjective:   Patient: Amber Garrett, Female    DOB: 07-Feb-1941, 82 y.o.   MRN: [id number]  Amber Garrett is a 82 y.o. Female who presents today for her Annual Wellness Visit. She has a history of hypertension, UTI, osteoarthritis, sciatica, vaginal atrophy.  Reports feeling generally well regarding health.  History of hyperlipidemia treated with rosuvastatin 5 mg three times weekly with dinner. Lipid panel normal on 08/12/22.  History of hypertension treated with lisinopril 5 mg daily. Blood pressure normal today at 120/70.  Takes supplemental Vitamin D.  Glucose normal. Kidney, liver, thyroid function normal. Electrolytes normal. CBC normal.   Mammogram last completed 12/17/21. No mammographic evidence of malignancy. Recommended repeat in 2024.  Colonoscopy last completed 04/04/16. Results showed diverticulosis in sigmoid colon. Examination otherwise normal. No repeat recommended.  12/24/21 bone density scan showed T-score -2.7 in right femoral neck.   Past Medical History:  Diagnosis Date   HTN (hypertension)    Hx: UTI (urinary tract infection)    OA (osteoarthritis)    Positive PPD    Sciatica    Vaginal atrophy      Family History  Problem Relation Age of Onset   Depression Father      Social History   Social History Narrative   Not on file     Review of Systems  Constitutional:  Negative for chills, fever, malaise/fatigue and weight loss.  HENT:  Negative for hearing loss, sinus pain and sore throat.   Respiratory:  Negative for cough, hemoptysis and shortness of breath.   Cardiovascular:  Negative for chest pain, palpitations, leg swelling and PND.  Gastrointestinal:  Negative for abdominal pain, constipation, diarrhea, heartburn, nausea and vomiting.   Genitourinary:  Negative for dysuria, frequency and urgency.  Musculoskeletal:  Negative for back pain, myalgias and neck pain.  Skin:  Negative for itching and rash.  Neurological:  Negative for dizziness, tingling, seizures and headaches.  Endo/Heme/Allergies:  Negative for polydipsia.  Psychiatric/Behavioral:  Negative for depression. The patient is not nervous/anxious.       Objective:   Vitals: BP 120/70   Pulse 88   Temp 98.7 F (37.1 C) (Tympanic)   Ht 5' 3.5" (1.613 m)   Wt 150 lb 1.9 oz (68.1 kg)   SpO2 98%   BMI 26.18 kg/m   Physical Exam Vitals and nursing note reviewed.  Constitutional:      General: She is not in acute distress.    Appearance: Normal appearance. She is not ill-appearing or toxic-appearing.  HENT:     Head: Normocephalic and atraumatic.     Right Ear: Hearing, tympanic membrane, ear canal and external ear normal.     Left Ear: Hearing, tympanic membrane, ear canal and external ear normal.     Mouth/Throat:     Pharynx: Oropharynx is clear.  Eyes:     Extraocular Movements: Extraocular movements intact.     Pupils: Pupils are equal, round, and reactive to light.  Neck:     Thyroid: No thyroid mass, thyromegaly or thyroid tenderness.     Vascular: No carotid bruit.  Cardiovascular:     Rate and Rhythm: Normal rate and regular rhythm. No extrasystoles are present.    Pulses:  Dorsalis pedis pulses are 1+ on the right side and 1+ on the left side.     Heart sounds: Normal heart sounds. No murmur heard.    No friction rub. No gallop.  Pulmonary:     Effort: Pulmonary effort is normal.     Breath sounds: Normal breath sounds. No decreased breath sounds, wheezing, rhonchi or rales.  Chest:     Chest wall: No mass.  Abdominal:     Palpations: Abdomen is soft. There is no hepatomegaly, splenomegaly or mass.     Tenderness: There is no abdominal tenderness.     Hernia: No hernia is present.  Genitourinary:    Comments: Pelvic exam  normal. No masses. Musculoskeletal:     Cervical back: Normal range of motion.     Right lower leg: No edema.     Left lower leg: No edema.  Lymphadenopathy:     Cervical: No cervical adenopathy.     Upper Body:     Right upper body: No supraclavicular adenopathy.     Left upper body: No supraclavicular adenopathy.  Skin:    General: Skin is warm and dry.  Neurological:     General: No focal deficit present.     Mental Status: She is alert and oriented to person, place, and time. Mental status is at baseline.     Sensory: Sensation is intact.     Motor: Motor function is intact. No weakness.     Deep Tendon Reflexes: Reflexes are normal and symmetric.  Psychiatric:        Attention and Perception: Attention normal.        Mood and Affect: Mood normal.        Speech: Speech normal.        Behavior: Behavior normal.        Thought Content: Thought content normal.        Cognition and Memory: Cognition normal.        Judgment: Judgment normal.      Most recent functional status assessment:    08/19/2022   10:06 AM  In your present state of health, do you have any difficulty performing the following activities:  Hearing? 1  Vision? 0  Difficulty concentrating or making decisions? 0  Walking or climbing stairs? 0  Dressing or bathing? 0  Doing errands, shopping? 0  Preparing Food and eating ? N  Using the Toilet? N  In the past six months, have you accidently leaked urine? N  Do you have problems with loss of bowel control? N  Managing your Medications? N  Managing your Finances? N  Housekeeping or managing your Housekeeping? N   Most recent fall risk assessment:    08/19/2022   10:06 AM  Fall Risk   Falls in the past year? 0  Number falls in past yr: 0  Injury with Fall? 0  Risk for fall due to : No Fall Risks  Follow up Falls prevention discussed    Most recent depression screenings:    08/19/2022   10:06 AM 08/13/2021    1:05 PM  PHQ 2/9 Scores  PHQ - 2  Score 0 0   Most recent cognitive screening:    08/19/2022   10:07 AM  6CIT Screen  What Year? 0 points  What month? 0 points  What time? 0 points  Count back from 20 0 points  Months in reverse 0 points  Repeat phrase 0 points  Total Score 0 points  Results:   Studies obtained and personally reviewed by me:  Mammogram last completed 12/17/21. No mammographic evidence of malignancy. Recommended repeat in 2024.  Colonoscopy last completed 04/04/16. Results showed diverticulosis in sigmoid colon. Examination otherwise normal. No repeat recommended.  12/24/21 bone density scan showed T-score -2.7 in right femoral neck.   Labs:       Component Value Date/Time   NA 139 08/12/2022 0923   K 4.4 08/12/2022 0923   CL 103 08/12/2022 0923   CO2 25 08/12/2022 0923   GLUCOSE 97 08/12/2022 0923   BUN 23 08/12/2022 0923   CREATININE 0.98 08/12/2022 0923   CALCIUM 9.5 08/12/2022 0923   PROT 7.1 08/12/2022 0923   ALBUMIN 4.3 06/10/2016 1031   AST 16 08/12/2022 0923   ALT 13 08/12/2022 0923   ALKPHOS 54 06/10/2016 1031   BILITOT 0.4 08/12/2022 0923   GFRNONAA 60 08/02/2020 0954   GFRAA 70 08/02/2020 0954     Lab Results  Component Value Date   WBC 6.7 08/12/2022   HGB 14.0 08/12/2022   HCT 42.2 08/12/2022   MCV 95.0 08/12/2022   PLT 271 08/12/2022    Lab Results  Component Value Date   CHOL 188 08/12/2022   HDL 85 08/12/2022   LDLCALC 86 08/12/2022   TRIG 78 08/12/2022   CHOLHDL 2.2 08/12/2022    No results found for: "HGBA1C"   Lab Results  Component Value Date   TSH 2.71 08/12/2022    Assessment & Plan:   Hyperlipidemia: treated with rosuvastatin 5 mg three times weekly with dinner. Lipid panel normal on 08/12/22.  Hypertension: treated with lisinopril 5 mg daily. Blood pressure normal today at 120/70.  Takes supplemental Vitamin D. Ordered Vitamin D.  Mammogram: last completed 12/17/21. No mammographic evidence of malignancy. Recommended repeat in  2024.  Colonoscopy: last completed 04/04/16. Results showed diverticulosis in sigmoid colon. Examination otherwise normal. No repeat recommended.  12/24/21 bone density scan showed T-score -2.7 in right femoral neck. Ordered bone density scan.  Vaccine counseling: she will get her next Covid-19 booster in the fall. She will go to the pharmacy for her second dose of shingrix and tetanus update. She will return for the pneumococcal 20 vaccine.  Stool was negative for occult blood.     Annual wellness visit done today including the all of the following: Reviewed patient's Family Medical History Reviewed and updated list of patient's medical providers Assessment of cognitive impairment was done Assessed patient's functional ability Established a written schedule for health screening services Health Risk Assessent Completed and Reviewed  Discussed health benefits of physical activity, and encouraged her to engage in regular exercise appropriate for her age and condition.        I,Alexander Ruley,acting as a Neurosurgeonscribe for Margaree MackintoshMary J Camay Pedigo, MD.,have documented all relevant documentation on the behalf of Margaree MackintoshMary J Valdez Brannan, MD,as directed by  Margaree MackintoshMary J Alivya Wegman, MD while in the presence of Margaree MackintoshMary J Juanita Devincent, MD.   ***

## 2022-08-13 LAB — COMPLETE METABOLIC PANEL WITH GFR
AG Ratio: 1.7 (calc) (ref 1.0–2.5)
ALT: 13 U/L (ref 6–29)
AST: 16 U/L (ref 10–35)
Albumin: 4.5 g/dL (ref 3.6–5.1)
Alkaline phosphatase (APISO): 68 U/L (ref 37–153)
BUN: 23 mg/dL (ref 7–25)
CO2: 25 mmol/L (ref 20–32)
Calcium: 9.5 mg/dL (ref 8.6–10.4)
Chloride: 103 mmol/L (ref 98–110)
Creat: 0.98 mg/dL (ref 0.60–1.00)
Globulin: 2.6 g/dL (calc) (ref 1.9–3.7)
Glucose, Bld: 97 mg/dL (ref 65–99)
Potassium: 4.4 mmol/L (ref 3.5–5.3)
Sodium: 139 mmol/L (ref 135–146)
Total Bilirubin: 0.4 mg/dL (ref 0.2–1.2)
Total Protein: 7.1 g/dL (ref 6.1–8.1)
eGFR: 59 mL/min/{1.73_m2} — ABNORMAL LOW (ref >=60)

## 2022-08-13 LAB — CBC WITH DIFFERENTIAL/PLATELET
Basophils Absolute: 40 cells/uL (ref 0–200)
Basophils Relative: 0.6 %
Eosinophils Absolute: 161 cells/uL (ref 15–500)
Eosinophils Relative: 2.4 %
HCT: 42.2 % (ref 35.0–45.0)
Hemoglobin: 14 g/dL (ref 11.7–15.5)
MCH: 31.5 pg (ref 27.0–33.0)
MCV: 95 fL (ref 80.0–100.0)
MPV: 11 fL (ref 7.5–12.5)
Monocytes Relative: 9.4 %
Platelets: 271 10*3/uL (ref 140–400)
RDW: 12.3 % (ref 11.0–15.0)
Total Lymphocyte: 33.6 %
WBC: 6.7 10*3/uL (ref 3.8–10.8)

## 2022-08-13 LAB — LIPID PANEL
Cholesterol: 188 mg/dL (ref <200)
HDL: 85 mg/dL (ref >=50)
LDL Cholesterol (Calc): 86 mg/dL (calc)
Non-HDL Cholesterol (Calc): 103 mg/dL (calc) (ref <130)
Total CHOL/HDL Ratio: 2.2 (calc) (ref <5.0)
Triglycerides: 78 mg/dL (ref <150)

## 2022-08-13 LAB — TSH: TSH: 2.71 mIU/L (ref 0.40–4.50)

## 2022-08-18 ENCOUNTER — Other Ambulatory Visit: Payer: Medicare Other

## 2022-08-19 ENCOUNTER — Encounter: Payer: Self-pay | Admitting: Internal Medicine

## 2022-08-19 ENCOUNTER — Ambulatory Visit (INDEPENDENT_AMBULATORY_CARE_PROVIDER_SITE_OTHER): Payer: Medicare Other | Admitting: Internal Medicine

## 2022-08-19 VITALS — BP 120/70 | HR 88 | Temp 98.7°F | Ht 63.5 in | Wt 150.1 lb

## 2022-08-19 DIAGNOSIS — H903 Sensorineural hearing loss, bilateral: Secondary | ICD-10-CM

## 2022-08-19 DIAGNOSIS — M810 Age-related osteoporosis without current pathological fracture: Secondary | ICD-10-CM

## 2022-08-19 DIAGNOSIS — K580 Irritable bowel syndrome with diarrhea: Secondary | ICD-10-CM

## 2022-08-19 DIAGNOSIS — F5101 Primary insomnia: Secondary | ICD-10-CM | POA: Diagnosis not present

## 2022-08-19 DIAGNOSIS — I10 Essential (primary) hypertension: Secondary | ICD-10-CM | POA: Diagnosis not present

## 2022-08-19 DIAGNOSIS — Z87898 Personal history of other specified conditions: Secondary | ICD-10-CM | POA: Diagnosis not present

## 2022-08-19 DIAGNOSIS — E7800 Pure hypercholesterolemia, unspecified: Secondary | ICD-10-CM

## 2022-08-19 DIAGNOSIS — Z9889 Other specified postprocedural states: Secondary | ICD-10-CM

## 2022-08-19 DIAGNOSIS — Z0000 Encounter for general adult medical examination without abnormal findings: Secondary | ICD-10-CM

## 2022-08-19 LAB — VITAMIN D 25 HYDROXY (VIT D DEFICIENCY, FRACTURES): Vit D, 25-Hydroxy: 47 ng/mL (ref 30–100)

## 2022-08-19 LAB — HEMOCCULT GUIAC POC 1CARD (OFFICE): Fecal Occult Blood, POC: NEGATIVE

## 2022-08-19 LAB — POCT URINALYSIS DIPSTICK
Bilirubin, UA: NEGATIVE
Blood, UA: NEGATIVE
Glucose, UA: NEGATIVE
Ketones, UA: NEGATIVE
Leukocytes, UA: NEGATIVE
Nitrite, UA: NEGATIVE
Protein, UA: NEGATIVE
Spec Grav, UA: 1.015 (ref 1.010–1.025)
Urobilinogen, UA: 0.2 E.U./dL
pH, UA: 5 (ref 5.0–8.0)

## 2022-08-19 NOTE — Patient Instructions (Signed)
She will get Pneumococcal 20 vaccine here soon. We will get records from [facility] where she had lumbar surgery per Dr. Marcia Brash.Vitamin D level drawn today. Repeat bone density this year due to dx of osteoporosis last year and lumbar surgery. May get RSV and Shingrix vaccine at pharmacy.

## 2022-08-27 DIAGNOSIS — M8589 Other specified disorders of bone density and structure, multiple sites: Secondary | ICD-10-CM | POA: Diagnosis not present

## 2022-08-27 DIAGNOSIS — Z780 Asymptomatic menopausal state: Secondary | ICD-10-CM | POA: Diagnosis not present

## 2022-08-27 LAB — HM DEXA SCAN

## 2022-08-28 ENCOUNTER — Encounter: Payer: Self-pay | Admitting: Internal Medicine

## 2022-09-04 ENCOUNTER — Telehealth: Payer: Self-pay | Admitting: Internal Medicine

## 2022-09-04 NOTE — Telephone Encounter (Signed)
Amber Garrett called to see if we had results of her Bone Density yet. We have not received results yet. Once we do she would like a call of how they are.

## 2022-09-09 NOTE — Telephone Encounter (Signed)
Patient informed of results,  Dexa scan results mailed to patient

## 2022-09-09 NOTE — Telephone Encounter (Signed)
Bone Density is in, Patient would like the results

## 2022-11-17 ENCOUNTER — Other Ambulatory Visit: Payer: Self-pay | Admitting: Internal Medicine

## 2022-12-23 DIAGNOSIS — Z1231 Encounter for screening mammogram for malignant neoplasm of breast: Secondary | ICD-10-CM | POA: Diagnosis not present

## 2022-12-23 LAB — HM MAMMOGRAPHY

## 2022-12-25 ENCOUNTER — Ambulatory Visit: Payer: Medicare Other

## 2022-12-25 VITALS — BP 110/80 | HR 87 | Temp 98.0°F | Ht 63.5 in | Wt 150.0 lb

## 2022-12-25 DIAGNOSIS — Z23 Encounter for immunization: Secondary | ICD-10-CM | POA: Diagnosis not present

## 2022-12-25 NOTE — Progress Notes (Signed)
Patient received a pneumococcal 20 vaccine, left deltoid IM today without complications by CMA.

## 2022-12-26 ENCOUNTER — Encounter: Payer: Self-pay | Admitting: Internal Medicine

## 2023-01-03 NOTE — Patient Instructions (Addendum)
Pneumococcal 20 vaccine given today by CMA. No visit by MD.

## 2023-01-07 DIAGNOSIS — Z23 Encounter for immunization: Secondary | ICD-10-CM | POA: Diagnosis not present

## 2023-01-14 DIAGNOSIS — D225 Melanocytic nevi of trunk: Secondary | ICD-10-CM | POA: Diagnosis not present

## 2023-01-14 DIAGNOSIS — L853 Xerosis cutis: Secondary | ICD-10-CM | POA: Diagnosis not present

## 2023-01-14 DIAGNOSIS — C44729 Squamous cell carcinoma of skin of left lower limb, including hip: Secondary | ICD-10-CM | POA: Diagnosis not present

## 2023-01-14 DIAGNOSIS — L821 Other seborrheic keratosis: Secondary | ICD-10-CM | POA: Diagnosis not present

## 2023-01-14 DIAGNOSIS — L570 Actinic keratosis: Secondary | ICD-10-CM | POA: Diagnosis not present

## 2023-01-14 DIAGNOSIS — D2239 Melanocytic nevi of other parts of face: Secondary | ICD-10-CM | POA: Diagnosis not present

## 2023-01-14 DIAGNOSIS — D485 Neoplasm of uncertain behavior of skin: Secondary | ICD-10-CM | POA: Diagnosis not present

## 2023-01-22 DIAGNOSIS — C44729 Squamous cell carcinoma of skin of left lower limb, including hip: Secondary | ICD-10-CM | POA: Diagnosis not present

## 2023-01-26 ENCOUNTER — Telehealth: Payer: Self-pay | Admitting: Internal Medicine

## 2023-01-26 ENCOUNTER — Encounter: Payer: Self-pay | Admitting: Internal Medicine

## 2023-01-26 ENCOUNTER — Ambulatory Visit (INDEPENDENT_AMBULATORY_CARE_PROVIDER_SITE_OTHER): Payer: Medicare Other | Admitting: Internal Medicine

## 2023-01-26 VITALS — BP 108/80 | HR 91 | Temp 99.0°F | Ht 63.5 in | Wt 150.0 lb

## 2023-01-26 DIAGNOSIS — J069 Acute upper respiratory infection, unspecified: Secondary | ICD-10-CM

## 2023-01-26 MED ORDER — HYDROCODONE BIT-HOMATROP MBR 5-1.5 MG/5ML PO SOLN: 5.0000 mL | Freq: Three times a day (TID) | ORAL | 0 refills | Status: DC | PRN

## 2023-01-26 MED ORDER — AZITHROMYCIN 250 MG PO TABS: ORAL_TABLET | ORAL | 0 refills | Status: AC

## 2023-01-26 NOTE — Progress Notes (Signed)
Patient Care Team: Margaree Mackintosh, MD as PCP - General (Internal Medicine)  Visit Date: 01/26/23  Subjective:    Patient ID: Amber Garrett , Female   DOB: 12/11/1940, 82 y.o.    MRN: [id number]   82 y.o. Female presents today for cough, sore throat since 01/23/23. Denies yellow/green sputum production. Reports 3 negative at-home Covid-19 tests. She is supposed to start traveling on Wednesday. She had a large social event at her house on 01/21/23.  Past Medical History:  Diagnosis Date   HTN (hypertension)    Hx: UTI (urinary tract infection)    OA (osteoarthritis)    Positive PPD    Sciatica    Vaginal atrophy      Family History  Problem Relation Age of Onset   Depression Father     Social History   Social History Narrative   Not on file      Review of Systems  Constitutional:  Negative for fever and malaise/fatigue.  HENT:  Positive for sore throat. Negative for congestion.   Eyes:  Negative for blurred vision.  Respiratory:  Positive for cough. Negative for sputum production and shortness of breath.   Cardiovascular:  Negative for chest pain, palpitations and leg swelling.  Gastrointestinal:  Negative for vomiting.  Musculoskeletal:  Negative for back pain.  Skin:  Negative for rash.  Neurological:  Negative for loss of consciousness and headaches.        Objective:   Vitals: BP 108/80   Pulse 91   Temp 99 F (37.2 C)   Ht 5' 3.5" (1.613 m)   Wt 150 lb (68 kg)   SpO2 98%   BMI 26.15 kg/m    Physical Exam Vitals and nursing note reviewed.  Constitutional:      General: She is not in acute distress.    Appearance: Normal appearance. She is not toxic-appearing.  HENT:     Head: Normocephalic and atraumatic.     Right Ear: Hearing, tympanic membrane, ear canal and external ear normal.     Left Ear: Hearing, tympanic membrane, ear canal and external ear normal.     Mouth/Throat:     Pharynx: Oropharynx is clear. No oropharyngeal exudate.   Pulmonary:     Effort: Pulmonary effort is normal. No respiratory distress.     Breath sounds: Normal breath sounds. No wheezing or rales.  Skin:    General: Skin is warm and dry.  Neurological:     Mental Status: She is alert and oriented to person, place, and time. Mental status is at baseline.  Psychiatric:        Mood and Affect: Mood normal.        Behavior: Behavior normal.        Thought Content: Thought content normal.        Judgment: Judgment normal.       Results:   Studies obtained and personally reviewed by me:   Labs:       Component Value Date/Time   NA 139 08/12/2022 0923   K 4.4 08/12/2022 0923   CL 103 08/12/2022 0923   CO2 25 08/12/2022 0923   GLUCOSE 97 08/12/2022 0923   BUN 23 08/12/2022 0923   CREATININE 0.98 08/12/2022 0923   CALCIUM 9.5 08/12/2022 0923   PROT 7.1 08/12/2022 0923   ALBUMIN 4.3 06/10/2016 1031   AST 16 08/12/2022 0923   ALT 13 08/12/2022 0923   ALKPHOS 54 06/10/2016 1031   BILITOT 0.4 08/12/2022  3329   GFRNONAA 60 08/02/2020 0954   GFRAA 70 08/02/2020 0954     Lab Results  Component Value Date   WBC 6.7 08/12/2022   HGB 14.0 08/12/2022   HCT 42.2 08/12/2022   MCV 95.0 08/12/2022   PLT 271 08/12/2022    Lab Results  Component Value Date   CHOL 188 08/12/2022   HDL 85 08/12/2022   LDLCALC 86 08/12/2022   TRIG 78 08/12/2022   CHOLHDL 2.2 08/12/2022    No results found for: "HGBA1C"   Lab Results  Component Value Date   TSH 2.71 08/12/2022      Assessment & Plan:   Acute upper respiratory infection: prescribed Z-Pak two tabs day 1 followed by one tab days 2-5, Hycodan syrup 1 tsp every 8 hours as needed for cough. Get plenty of rest and stay well-hydrated. Contact us if symptoms worsen or do not improve.    I,Alexander Ruley,acting as a Neurosurgeon for Margaree Mackintosh, MD.,have documented all relevant documentation on the behalf of Margaree Mackintosh, MD,as directed by  Margaree Mackintosh, MD while in the presence of Margaree Mackintosh, MD.   I, Margaree Mackintosh, MD, have reviewed all documentation for this visit. The documentation on 01/26/23 for the exam, diagnosis, procedures, and orders are all accurate and complete.

## 2023-01-26 NOTE — Patient Instructions (Signed)
We are sorry you are not feeling well.  Take Hycodan 1 teaspoon every 8 hours as needed for cough.  Take Zithromax Z-PAK 2 tabs day 1 followed by 1 tab days 2 through 5.  Rest at home for few days and stay well-hydrated.  Try not to expose others until you are feeling better for few days.  Please call if symptoms not improving in 5 to 7 days or sooner if worse.

## 2023-01-26 NOTE — Telephone Encounter (Signed)
Amber Garrett [phone]  Peggy called to say she started getting sick on Friday with cough, now her ears, chest and throat hurts. She done a COVID test on Friday and last night that was negative. I have ask her to do another one just to be sure. I have scheduled her for 11:30 as long as this morning test is negative she can come in, if positive we will do video visit.

## 2023-02-20 DIAGNOSIS — Z23 Encounter for immunization: Secondary | ICD-10-CM | POA: Diagnosis not present

## 2023-04-07 DIAGNOSIS — H43813 Vitreous degeneration, bilateral: Secondary | ICD-10-CM | POA: Diagnosis not present

## 2023-04-07 DIAGNOSIS — H35033 Hypertensive retinopathy, bilateral: Secondary | ICD-10-CM | POA: Diagnosis not present

## 2023-04-07 DIAGNOSIS — H52223 Regular astigmatism, bilateral: Secondary | ICD-10-CM | POA: Diagnosis not present

## 2023-04-07 DIAGNOSIS — H04123 Dry eye syndrome of bilateral lacrimal glands: Secondary | ICD-10-CM | POA: Diagnosis not present

## 2023-04-07 DIAGNOSIS — H26493 Other secondary cataract, bilateral: Secondary | ICD-10-CM | POA: Diagnosis not present

## 2023-04-07 DIAGNOSIS — H524 Presbyopia: Secondary | ICD-10-CM | POA: Diagnosis not present

## 2023-06-01 ENCOUNTER — Other Ambulatory Visit: Payer: Self-pay | Admitting: Internal Medicine

## 2023-06-29 ENCOUNTER — Other Ambulatory Visit: Payer: Self-pay | Admitting: Internal Medicine

## 2023-06-29 DIAGNOSIS — I10 Essential (primary) hypertension: Secondary | ICD-10-CM

## 2023-08-20 ENCOUNTER — Ambulatory Visit: Payer: Self-pay

## 2023-08-20 ENCOUNTER — Encounter (HOSPITAL_COMMUNITY): Payer: Self-pay

## 2023-08-20 ENCOUNTER — Ambulatory Visit (HOSPITAL_COMMUNITY)
Admission: RE | Admit: 2023-08-20 | Discharge: 2023-08-20 | Disposition: A | Discharge status: Home / Self Care | Source: Ambulatory Visit | Attending: Emergency Medicine | Admitting: Emergency Medicine

## 2023-08-20 VITALS — BP 141/72 | HR 84 | Temp 99.6°F | Resp 16

## 2023-08-20 DIAGNOSIS — U071 COVID-19: Secondary | ICD-10-CM

## 2023-08-20 LAB — POC SARS CORONAVIRUS 2 AG -  ED: SARS Coronavirus 2 Ag: POSITIVE — AB

## 2023-08-20 MED ORDER — PAXLOVID (150/100) 10 X 150 MG & 10 X 100MG PO TBPK: 2.0000 | ORAL_TABLET | Freq: Two times a day (BID) | ORAL | 0 refills | Status: AC

## 2023-08-20 MED ORDER — HYDROCODONE BIT-HOMATROP MBR 5-1.5 MG/5ML PO SOLN: 5.0000 mL | Freq: Three times a day (TID) | ORAL | 0 refills | Status: AC | PRN

## 2023-08-20 NOTE — ED Provider Notes (Signed)
 [facility]    CSN: [id number] Arrival date & time: 08/20/23  1546    HISTORY   Chief Complaint  Patient presents with   Cough    Cough with fever - Entered by patient   HPI Amber Garrett is a pleasant, 83 y.o. female who presents to urgent care today. Pt c/o non-productive cough with fever, Tmax 100.6 last night.  States cough started 2 days ago and fever started last night.  Pt states she took aspirin and Claritin this morning and adds that she feels the cough is better today than yesterday.  States she also took some leftover cough syrup that contains hydrocodone which did help but does not have any more of it.  Pt denies any SOB/chest pain but states that her chest is sore from coughing.  Patient contacted her primary care provider's office, states she was instructed to come to either urgent care or the emergency room for further evaluation.  Patient denies known sick contacts, shortness of breath, chest pain.  The history is provided by the patient.  Cough Cough characteristics:  Non-productive Onset quality:  Sudden Duration:  1 day  Past Medical History:  Diagnosis Date   HTN (hypertension)    Hx: UTI (urinary tract infection)    OA (osteoarthritis)    Positive PPD    Sciatica    Vaginal atrophy    Patient Active Problem List   Diagnosis Date Noted   Acute pain of right knee 03/30/2017   Sciatica of right side 05/19/2016   Irritable bowel syndrome 12/29/2015   Osteopenia 12/14/2015   Depression 08/25/2013   Back pain 11/13/2011   Allergic rhinitis 11/13/2011   Hearing loss 04/03/2011   HTN (hypertension) 10/31/2010   Anxiety 10/31/2010   Insomnia 10/31/2010   Past Surgical History:  Procedure Laterality Date   APPENDECTOMY     CESAREAN SECTION     ENDOMETRIAL ABLATION     INGUINAL HERNIA REPAIR     OB History   No obstetric history on file.    Home Medications    Prior to Admission medications   Medication Sig Start Date End Date  Taking? Authorizing Provider  b complex vitamins tablet Take 1 tablet by mouth daily.    [provider]  calcium carbonate (OSCAL) 1500 (600 Ca) MG TABS tablet Take 1,000 mg by mouth 2 (two) times daily with a meal.     [provider]  cholecalciferol (VITAMIN D) 1000 units tablet Take 5,000 Units by mouth daily.     [provider]  eszopiclone (LUNESTA) 2 MG TABS tablet TAKE ONE TABLET (2MG  TOTAL) BY MOUTH AT BEDTIME AS NEEDED FOR SLEEP (take immediately BEFORE bedtime) 06/01/23   Baxley, Luanna Cole, MD  HYDROcodone bit-homatropine (HYCODAN) 5-1.5 MG/5ML syrup Take 5 mLs by mouth every 8 (eight) hours as needed for cough. 01/26/23   Margaree Mackintosh, MD  lisinopril (ZESTRIL) 5 MG tablet TAKE ONE TABLET EACH DAY 06/29/23   Margaree Mackintosh, MD  rosuvastatin (CRESTOR) 5 MG tablet TAKE ONE TABLET THREE TIMES A WEEK AT SUPPER 08/08/21   Margaree Mackintosh, MD    Family History Family History  Problem Relation Age of Onset   Depression Father    Social History Social History   Tobacco Use   Smoking status: Never   Smokeless tobacco: Never  Substance Use Topics   Alcohol use: Yes   Drug use: No   Allergies   Macrodantin  Review of Systems Review  of Systems  Respiratory:  Positive for cough.    Pertinent findings revealed after performing a 14 point review of systems has been noted in the history of present illness.  Physical Exam Vital Signs BP (!) 141/72 (BP Location: Right Arm)   Pulse 84   Temp 99.6 F (37.6 C) (Oral)   Resp 16   SpO2 95%   No data found.  Physical Exam Vitals and nursing note reviewed.  Constitutional:      General: She is not in acute distress.    Appearance: Normal appearance. She is not ill-appearing.  HENT:     Head: Normocephalic and atraumatic.     Salivary Glands: Right salivary gland is not diffusely enlarged or tender. Left salivary gland is not diffusely enlarged or tender.     Right Ear: Tympanic membrane, ear canal and  external ear normal. No drainage. No middle ear effusion. There is no impacted cerumen. Tympanic membrane is not erythematous or bulging.     Left Ear: Tympanic membrane, ear canal and external ear normal. No drainage.  No middle ear effusion. There is no impacted cerumen. Tympanic membrane is not erythematous or bulging.     Nose: Nose normal. No nasal deformity, septal deviation, mucosal edema, congestion or rhinorrhea.     Right Turbinates: Not enlarged, swollen or pale.     Left Turbinates: Not enlarged, swollen or pale.     Right Sinus: No maxillary sinus tenderness or frontal sinus tenderness.     Left Sinus: No maxillary sinus tenderness or frontal sinus tenderness.     Mouth/Throat:     Lips: Pink. No lesions.     Mouth: Mucous membranes are moist. No oral lesions.     Pharynx: Oropharynx is clear. Uvula midline. No posterior oropharyngeal erythema or uvula swelling.     Tonsils: No tonsillar exudate. 0 on the right. 0 on the left.  Eyes:     General: Lids are normal.        Right eye: No discharge.        Left eye: No discharge.     Extraocular Movements: Extraocular movements intact.     Conjunctiva/sclera: Conjunctivae normal.     Right eye: Right conjunctiva is not injected.     Left eye: Left conjunctiva is not injected.  Neck:     Trachea: Trachea and phonation normal.  Cardiovascular:     Rate and Rhythm: Normal rate and regular rhythm.     Pulses: Normal pulses.     Heart sounds: Normal heart sounds. No murmur heard.    No friction rub. No gallop.  Pulmonary:     Effort: Pulmonary effort is normal. No accessory muscle usage, prolonged expiration or respiratory distress.     Breath sounds: Normal breath sounds. No stridor, decreased air movement or transmitted upper airway sounds. No decreased breath sounds, wheezing, rhonchi or rales.  Chest:     Chest wall: No tenderness.  Musculoskeletal:        General: Normal range of motion.     Cervical back: Normal range of  motion and neck supple. Normal range of motion.  Lymphadenopathy:     Cervical: No cervical adenopathy.  Skin:    General: Skin is warm and dry.     Findings: No erythema or rash.  Neurological:     General: No focal deficit present.     Mental Status: She is alert and oriented to person, place, and time.  Psychiatric:  Mood and Affect: Mood normal.        Behavior: Behavior normal.     Visual Acuity Right Eye Distance:   Left Eye Distance:   Bilateral Distance:    Right Eye Near:   Left Eye Near:    Bilateral Near:     UC Couse / Diagnostics / Procedures:     Radiology No results found.  Procedures Procedures (including critical care time) EKG  Pending results:  Labs Reviewed  POC SARS CORONAVIRUS 2 AG -  ED - Abnormal; Notable for the following components:      Result Value   SARS Coronavirus 2 Ag Positive (*)    All other components within normal limits    Medications Ordered in UC: Medications - No data to display  UC Diagnoses / Final Clinical Impressions(s)   I have reviewed the triage vital signs and the nursing notes.  Pertinent labs & imaging results that were available during my care of the patient were reviewed by me and considered in my medical decision making (see chart for details).    Final diagnoses:  COVID-19   COVID-19 test positive, patient advised.  Last GFR was 59, patient provided with a prescription for Paxlovid at the renal dose.  Patient offered prescription for Promethazine DM, patient states that is not work for her, only [facility] Joy works for her and that her primary care provider usually prescribes this for her.  Patient provided with a prescription renewal for Hycodan.  Conservative care recommended.  Patient is occasion provided.  Return precautions advised.  Please see discharge instructions below for details of plan of care as provided to patient. ED Prescriptions     Medication Sig Dispense Auth. Provider   HYDROcodone  bit-homatropine (HYCODAN) 5-1.5 MG/5ML syrup Take 5 mLs by mouth every 8 (eight) hours as needed for cough. 120 mL Theadora Rama Scales, PA-C   nirmatrelvir/ritonavir, renal dosing, (PAXLOVID, 150/100,) 10 x 150 MG & 10 x 100MG  TBPK Take 2 tablets by mouth 2 (two) times daily for 5 days. Dosage for moderate renal impairment (eGFR >/= 30 to <60 mL/min): 150 mg nirmatrelvir (one 150 mg tablet) with 100 mg ritonavir (one 100 mg tablet), with both tablets taken together twice daily for 5 days. GFR 59 20 tablet Theadora Rama Scales, PA-C      I have reviewed the PDMP during this encounter.  Pending results:  Labs Reviewed  POC SARS CORONAVIRUS 2 AG -  ED - Abnormal; Notable for the following components:      Result Value   SARS Coronavirus 2 Ag Positive (*)    All other components within normal limits      Discharge Instructions      What is COVID-19?   ??V?D-19 stands for "coronavirus disease 2019." It is caused by a virus called ??RS-?oV-2.   C?V?D-19 mainly spreads from person to person usually when an infected person coughs, sneezes, or talks near other people.  A person can become infected and spread the virus to others, even without having any symptoms.  This one of the reasons COVID-19 spreads so quickly.   Can COVID-19 be prevented?   Yes!  The best way to prevent ??V?D-19 is to get vaccinated. In the [place], people age 64 months and older can get a vaccine.  In addition to vaccination, there are other things you can do to help protect yourself and others. You can:  ?Wash your hands often. ?Consider wearing a face mask in some situations. Masks can  help protect both the wearer and others around them. ?Stay home when you are sick. Try to avoid close contact with other people. ?Cover your mouth and nose with the inside of your elbow when you cough or sneeze. ?If someone in your home is sick, regularly clean things that are touched a lot. This includes counters, bedside tables,  doorknobs, computers, phones, and bathroom surfaces. ?Make sure that there is good ventilation (air flow) in your home. When possible, open windows to let fresh air in.  Experts recommend "layering" these strategies. This means doing more than 1 of the things above to protect yourself, especially at times when lots of people are sick.  What are the symptoms of COVID-19?   Symptoms usually start 3 to 5 days after a person is infected with the virus but for some people, it can take up to 2 weeks for symptoms to appear. Many infected people only have mild cold symptoms. Some people never show symptoms at all.  When symptoms do happen, they can include:  ? Fever ? Cough ? Trouble breathing ? Feeling tired ? Shaking chills ? Muscle aches ? Headache ? Sore throat ? Runny or stuffy nose ? Problems with sense of smell or taste ? Diarrhea and vomiting ? Rash or other skin problems  For most people, symptoms get better within a few days to weeks. But a small number of people get very sick and stop being able to breathe on their own. In severe cases, their organs stop working, which can lead to death.  Some people with C?VID-19 continue to have some symptoms for weeks or months. This seems to be more likely in people who are sick enough to need to stay in the hospital. But this can also happen in people who did not get very sick.  What should I do if I get COVID-19?   Stay home, rest, and drink plenty of fluids. You can also take acetaminophen (sample brand name: Tylenol) to relieve fever and aches. If this does not help, you can try medicines like ibuprofen (sample brand names: Advil, Motrin).  If you go to a walk-in clinic or a hospital because of your symptoms, tell someone right away why you are there. The staff might ask you to wear a mask or to wait someplace where you are less likely to spread your infection.  Whether or not you need to see a medical provider, stay home while you are  sick with C?V?D-19. Do not go to work or school until your fever has been gone for at least 24 hours without taking medicine such as acetaminophen.  If your breathing symptoms get worse, call your doctor or nurse for advice. If you think that you are having a medical emergency, call 911 for an ambulance  If I have COVID-19, do I need special treatment?   It depends on your age, health, and symptoms. Most people with mild C?VID-19 can rest at home until they get better. "Mild" means that you might have symptoms like fever, cough, or other cold symptoms, but you do not have trouble breathing. It often takes about 2 weeks for symptoms to improve, but it's not the same for everyone.  Doctors do recommend treatment for people who are at risk for getting seriously ill, even if their symptoms are mild. This includes:  ?Adults 65 years or older ?Adults who have certain health conditions - Examples include a weaker than normal immune system, diabetes, serious heart or lung disease, chronic  kidney disease, and obesity. ?Adults 50 years or older who have not been vaccinated  If you are not sure if you fit into any of these categories, ask your doctor or nurse about treatment. They can talk to you about the risks and benefits.  How is COVID-19 treated?   The medicine most often used is an "antiviral" called nirmatrelvir-ritonavir (brand name: Paxlovid). This can lower your risk of getting sicker.  If your doctor suggests this treatment, it's important to know:  ?Paxlovid comes as several pills that you take for 5 days. ?Treatment should be started within 5 days after symptoms begin. This is why it's important to test early so you know if you have C?V?D-19 as soon as possible. ?Before prescribing Paxlovid, your medical provider will review any other medicines and supplements that you take. In some cases, they might want to change or stop your other medicines while you take Paxlovid.  Some people who are  treated with Paxlovid get something called "viral rebound." This means that they start testing negative after having C?V?D-19, but then test positive again. Symptoms might also come back, though they are almost always mild. If you are at risk for serious illness, the benefits of treatment are still greater than the risk of viral rebound.  For people who cannot take Paxlovid, there are other options for treatment that you may discuss with your medical provider.  Am I at risk for getting seriously ill?   It depends on your age, your health, and whether you have been vaccinated. In some people, ??V?D-19 leads to serious problems like ???um?ni?, which can cause a person to not get enough oxygen. It can also lead to heart problems, or even death. This risk gets higher as people get older. It is also higher in people who have other health problems like serious heart disease, chronic kidney disease, type 2 diabetes, chronic obstructive pulmonary disease ("COPD"), sickle cell disease, or obesity. People who have a weak immune system for other reasons (for example, HIV infection or certain medicines), asthma, cystic fibrosis, type 1 diabetes, or high blood pressure might also be at higher risk for serious problems.  Getting vaccinated makes people much less likely to get seriously ill with ??V?D-19.  When should I seek medical attention?   Call your doctor if:  ?You develop new shortness of breath, or your breathing gets worse (but you can still talk in full sentences). ?You become weak or dizzy. ?You have very dark urine or do not urinate for more than 8 hours. ?You have new or worsening symptoms that concern you - ??VID-19 symptoms can include fever, cough, feeling very tired, shaking, chills, headache, and trouble swallowing. They can also include digestive problems like vomiting or diarrhea.  Call 911 for an ambulance if:  ?You are having so much trouble breathing that you cannot speak a full  sentence. ?You are very confused or cannot stay awake. ?Your lips or skin start to turn blue. ?You think that you might be having a medical emergency - Examples include severe chest pain, feeling extremely weak or like you might pass out, or losing control of your body (like being unable to speak normally or move your arm or leg).  Where can I go to learn more?   You can find more information about ?OVID-19 at the following websites: ?[place] Centers for Disease Control and Prevention ("CDC"): IndexCrawler.co.za ?World Health Organization ("WHO"): [employer]Salon.es       Disposition Upon Discharge:  Condition: stable for  discharge home  Patient presented with an acute illness with associated systemic symptoms and significant discomfort requiring urgent management. In my opinion, this is a condition that a prudent lay person (someone who possesses an average knowledge of health and medicine) may potentially expect to result in complications if not addressed urgently such as respiratory distress, impairment of bodily function or dysfunction of bodily organs.   Routine symptom specific, illness specific and/or disease specific instructions were discussed with the patient and/or caregiver at length.   As such, the patient has been evaluated and assessed, work-up was performed and treatment was provided in alignment with urgent care protocols and evidence based medicine.  Patient/parent/caregiver has been advised that the patient may require follow up for further testing and treatment if the symptoms continue in spite of treatment, as clinically indicated and appropriate.  Patient/parent/caregiver has been advised to return to the [facility] or PCP if no better; to PCP or the Emergency Department if new signs and symptoms develop, or if the current signs or symptoms continue to change or worsen for further workup, evaluation and treatment as clinically indicated and  appropriate  The patient will follow up with their current PCP if and as advised. If the patient does not currently have a PCP we will assist them in obtaining one.   The patient may need specialty follow up if the symptoms continue, in spite of conservative treatment and management, for further workup, evaluation, consultation and treatment as clinically indicated and appropriate.  Patient/parent/caregiver verbalized understanding and agreement of plan as discussed.  All questions were addressed during visit.  Please see discharge instructions below for further details of plan.  This office note has been dictated using Teaching laboratory technician.  Unfortunately, this method of dictation can sometimes lead to typographical or grammatical errors.  I apologize for your inconvenience in advance if this occurs.  Please do not hesitate to reach out to me if clarification is needed.      Eloise Hake Scales, PA-C 08/20/23 1701

## 2023-08-20 NOTE — Discharge Instructions (Signed)
 What is COVID-19?   ??V?D-19 stands for "coronavirus disease 2019." It is caused by a virus called ??RS-?oV-2.   C?V?D-19 mainly spreads from person to person usually when an infected person coughs, sneezes, or talks near other people.  A person can become infected and spread the virus to others, even without having any symptoms.  This one of the reasons COVID-19 spreads so quickly.   Can COVID-19 be prevented?   Yes!  The best way to prevent ??V?D-19 is to get vaccinated. In the [place], people age 61 months and older can get a vaccine.  In addition to vaccination, there are other things you can do to help protect yourself and others. You can:  ?Wash your hands often. ?Consider wearing a face mask in some situations. Masks can help protect both the wearer and others around them. ?Stay home when you are sick. Try to avoid close contact with other people. ?Cover your mouth and nose with the inside of your elbow when you cough or sneeze. ?If someone in your home is sick, regularly clean things that are touched a lot. This includes counters, bedside tables, doorknobs, computers, phones, and bathroom surfaces. ?Make sure that there is good ventilation (air flow) in your home. When possible, open windows to let fresh air in.  Experts recommend "layering" these strategies. This means doing more than 1 of the things above to protect yourself, especially at times when lots of people are sick.  What are the symptoms of COVID-19?   Symptoms usually start 3 to 5 days after a person is infected with the virus but for some people, it can take up to 2 weeks for symptoms to appear. Many infected people only have mild cold symptoms. Some people never show symptoms at all.  When symptoms do happen, they can include:  ? Fever ? Cough ? Trouble breathing ? Feeling tired ? Shaking chills ? Muscle aches ? Headache ? Sore throat ? Runny or stuffy nose ? Problems with sense of smell or taste ? Diarrhea and  vomiting ? Rash or other skin problems  For most people, symptoms get better within a few days to weeks. But a small number of people get very sick and stop being able to breathe on their own. In severe cases, their organs stop working, which can lead to death.  Some people with C?VID-19 continue to have some symptoms for weeks or months. This seems to be more likely in people who are sick enough to need to stay in the hospital. But this can also happen in people who did not get very sick.  What should I do if I get COVID-19?   Stay home, rest, and drink plenty of fluids. You can also take acetaminophen (sample brand name: Tylenol) to relieve fever and aches. If this does not help, you can try medicines like ibuprofen (sample brand names: Advil, Motrin).  If you go to a walk-in clinic or a hospital because of your symptoms, tell someone right away why you are there. The staff might ask you to wear a mask or to wait someplace where you are less likely to spread your infection.  Whether or not you need to see a medical provider, stay home while you are sick with C?V?D-19. Do not go to work or school until your fever has been gone for at least 24 hours without taking medicine such as acetaminophen.  If your breathing symptoms get worse, call your doctor or nurse for advice. If you think  that you are having a medical emergency, call 911 for an ambulance  If I have COVID-19, do I need special treatment?   It depends on your age, health, and symptoms. Most people with mild C?VID-19 can rest at home until they get better. "Mild" means that you might have symptoms like fever, cough, or other cold symptoms, but you do not have trouble breathing. It often takes about 2 weeks for symptoms to improve, but it's not the same for everyone.  Doctors do recommend treatment for people who are at risk for getting seriously ill, even if their symptoms are mild. This includes:  ?Adults 65 years or older ?Adults  who have certain health conditions - Examples include a weaker than normal immune system, diabetes, serious heart or lung disease, chronic kidney disease, and obesity. ?Adults 50 years or older who have not been vaccinated  If you are not sure if you fit into any of these categories, ask your doctor or nurse about treatment. They can talk to you about the risks and benefits.  How is COVID-19 treated?   The medicine most often used is an "antiviral" called nirmatrelvir-ritonavir (brand name: Paxlovid). This can lower your risk of getting sicker.  If your doctor suggests this treatment, it's important to know:  ?Paxlovid comes as several pills that you take for 5 days. ?Treatment should be started within 5 days after symptoms begin. This is why it's important to test early so you know if you have C?V?D-19 as soon as possible. ?Before prescribing Paxlovid, your medical provider will review any other medicines and supplements that you take. In some cases, they might want to change or stop your other medicines while you take Paxlovid.  Some people who are treated with Paxlovid get something called "viral rebound." This means that they start testing negative after having C?V?D-19, but then test positive again. Symptoms might also come back, though they are almost always mild. If you are at risk for serious illness, the benefits of treatment are still greater than the risk of viral rebound.  For people who cannot take Paxlovid, there are other options for treatment that you may discuss with your medical provider.  Am I at risk for getting seriously ill?   It depends on your age, your health, and whether you have been vaccinated. In some people, ??V?D-19 leads to serious problems like ???um?ni?, which can cause a person to not get enough oxygen. It can also lead to heart problems, or even death. This risk gets higher as people get older. It is also higher in people who have other health problems like  serious heart disease, chronic kidney disease, type 2 diabetes, chronic obstructive pulmonary disease ("COPD"), sickle cell disease, or obesity. People who have a weak immune system for other reasons (for example, HIV infection or certain medicines), asthma, cystic fibrosis, type 1 diabetes, or high blood pressure might also be at higher risk for serious problems.  Getting vaccinated makes people much less likely to get seriously ill with ??V?D-19.  When should I seek medical attention?   Call your doctor if:  ?You develop new shortness of breath, or your breathing gets worse (but you can still talk in full sentences). ?You become weak or dizzy. ?You have very dark urine or do not urinate for more than 8 hours. ?You have new or worsening symptoms that concern you - ??VID-19 symptoms can include fever, cough, feeling very tired, shaking, chills, headache, and trouble swallowing. They can also include digestive  problems like vomiting or diarrhea.  Call 911 for an ambulance if:  ?You are having so much trouble breathing that you cannot speak a full sentence. ?You are very confused or cannot stay awake. ?Your lips or skin start to turn blue. ?You think that you might be having a medical emergency - Examples include severe chest pain, feeling extremely weak or like you might pass out, or losing control of your body (like being unable to speak normally or move your arm or leg).  Where can I go to learn more?   You can find more information about ?OVID-19 at the following websites: ?[place]  Centers for Disease Control and Prevention ("CDC"): IndexCrawler.co.za ?World Health Organization ("WHO"): AffordableSalon.es

## 2023-08-20 NOTE — Telephone Encounter (Signed)
  Chief Complaint: cough/fever  Disposition: [] ED /[] Urgent Care (no appt availability in office) / [] Appointment(In office/virtual)/ []  [facility]/ [] Home Care/ [] Refused Recommended Disposition /[] [facility]/ [x]  Follow-up with PCP Additional Notes: Pt called with concerns of non-productive cough with fever. Cough started on Tuesday and fever started last night. Temp las night was 100.6 and currently 99.7. Pt has taken Aspirin and Claritin this morning. Pt feels the cough is better than yesterday. Pt denies any SOB/chest pain. Pt did mentioned chest is sore from coughing. No PCP appt until 4/28. RN  called [facility]  for assistance but was unable to reach anyone. RN explained to pt that message would be sent to office and they would call with further directions.  RN advised pt that if symptoms worsen to seek care at an UC. Pt verbalized understanding.            Reason for Disposition  SEVERE coughing spells (e.g., whooping sound after coughing, vomiting after coughing)  Answer Assessment - Initial Assessment Questions 1. ONSET: "When did the cough begin?"      Tuesday  2. SEVERITY: "How bad is the cough today?"      Better today 3. SPUTUM: "Describe the color of your sputum" (none, dry cough; clear, white, yellow, green)     Denies  4. HEMOPTYSIS: "Are you coughing up any blood?" If so ask: "How much?" (flecks, streaks, tablespoons, etc.)     denies 5. DIFFICULTY BREATHING: "Are you having difficulty breathing?" If Yes, ask: "How bad is it?" (e.g., mild, moderate, severe)    - MILD: No SOB at rest, mild SOB with walking, speaks normally in sentences, can lie down, no retractions, pulse < 100.    - MODERATE: SOB at rest, SOB with minimal exertion and prefers to sit, cannot lie down flat, speaks in phrases, mild retractions, audible wheezing, pulse 100-120.    - SEVERE: Very SOB at rest, speaks in single words, struggling to breathe, sitting hunched forward,  retractions, pulse > 120      denies 6. FEVER: "Do you have a fever?" If Yes, ask: "What is your temperature, how was it measured, and when did it start?"     100.6/ 99.7 7. CARDIAC HISTORY: "Do you have any history of heart disease?" (e.g., heart attack, congestive heart failure)      dnies 8. LUNG HISTORY: "Do you have any history of lung disease?"  (e.g., pulmonary embolus, asthma, emphysema)     denies 9. PE RISK FACTORS: "Do you have a history of blood clots?" (or: recent major surgery, recent prolonged travel, bedridden)     denies 10. OTHER SYMPTOMS: "Do you have any other symptoms?" (e.g., runny nose, wheezing, chest pain)       Fever, runny nose  Protocols used: Cough - Acute Non-Productive-A-AH

## 2023-08-20 NOTE — ED Triage Notes (Signed)
 Patient here today with c/o cough and nasal congestion since Tuesday and fever and chills last night. She tried taking Claritin and a 24 hour nasal spray no relief. Patient also took some old hydrocodone cough syrup that she had from September which helped but she does not have anymore.

## 2023-08-25 ENCOUNTER — Ambulatory Visit: Payer: Medicare Other | Admitting: Internal Medicine

## 2023-09-02 NOTE — Progress Notes (Signed)
 Annual Wellness Visit   Patient Care Team: Sylvan Evener, MD as PCP - General (Internal Medicine)  Visit Date: 09/08/23   Chief Complaint  Patient presents with   Medicare Wellness   Annual Exam   Subjective:  Patient: Amber Garrett, Female DOB: 31-Aug-1940, 83 y.o. MRN: [id number] Amber Garrett is a 83 y.o. Female who presents today for her Annual Wellness Visit. Patient has HTN (Hypertension); Anxiety; Insomnia; Hearing Loss; Back Pain; Allergic Rhinitis; Depression; Osteopenia; Irritable Bowel Syndrome; Sciatica, Right Side; and Acute Pain, Right Knee.  Recently was sick and was seen at an Urgent Care; diagnosed with Covid-19, her first time contracting it.   History of Hypertension treated with Lisinopril  5 mg daily. Blood Pressure: normotensive today at 120/80.   History of Hyperlipidemia w/ 09/07/2023 Lipid Panel: WNL.  History of Insomnia treated with Lunesta  2 mg as needed.   History of Hearing Loss managed w/ hearing aids.   Labs 09/07/2023 CBC: WNL CMP, compared to 08/2022: Glucose 101, elevated from 97; Creatinine 0.97, decreased from 98; otherwise WNL. TSH: 1.73   Mammogram 12/26/2022  normal  with repeat recommendation of 2025.  Colonoscopy 04/04/2016  found multiple Medium-mouthed Diverticula in the sigmoid colon; otherwise normal. No repeat recommended.  Bone Density 08/27/2022  T-score Right Femoral Neck  -2.4, osteopenic. History of Osteopenia managed with Calcium  carbonate 1000 mg twice daily and Vitamin-D 5000 units daily.   Vaccine Counseling: Due for Covid-19, Shingles 1/2, and Tdap; UTD on Flu and PNA. Past Medical History:  Diagnosis Date   HTN (hypertension)    Hx: UTI (urinary tract infection)    OA (osteoarthritis)    Positive PPD    Sciatica    Vaginal atrophy    Medical/Surgical History Narrative:  Allergic/Intolerant to: Macrodantin - rash  2023 - Lumbar Disc Surgery/Microdiscectomy, Right L3 w/ Dr. Elza [facility] after trying PT and epidural steroid injections.  2019 - Right Knee Pain seen by Dr. Lovenia Ruby. Torn posterior horn, medial meniscus and subchondral fracture, medial tibial plateau seen on MRI. Knee was injected.   2014 - Cataract Extraction by Dr. Danley Dusky in August and October  2007 - Colonoscopy w/ Dr. Dellis Fermo showed only Diverticulosis  2006 - Echocardiogram to evaluate murmur: had moderate tricuspid regurgitation and elevation of right ventricular systolic pressure of 30-40 mm consistent w/ mild pulmonary hypertension. Mitral valve leaflets were thickened but open well w/o stenosis. Trace mitral regurgitation.   1973 - Cesarean section. Incisional Hernia w/ Appendectomy.   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 Difficile, and MRSA Brother in good health as far as is known Son & Daughter in good health as far as is known Social History   Social History Narrative   Has a college degree. Non-smoker, social alcohol consumption. Resides alone. Widow - husband passed away at [facility] w/ hx of MS; has 1 son and 1 daughter. Exercises regularly and enjoys traveling.    Review of Systems  Constitutional:  Negative for chills, fever, malaise/fatigue and weight loss.  HENT:  Negative for hearing loss, sinus pain and sore throat.   Respiratory:  Negative for cough, hemoptysis and shortness of breath.   Cardiovascular:  Negative for chest pain, palpitations, leg swelling and PND.  Gastrointestinal:  Negative for abdominal pain, constipation, diarrhea, heartburn, nausea and vomiting.  Genitourinary:   Negative for dysuria, frequency and urgency.  Musculoskeletal:  Negative for back pain, myalgias and neck pain.  Skin:  Negative for itching and rash.  Neurological:  Negative for dizziness, tingling, seizures and headaches.  Endo/Heme/Allergies:  Negative for polydipsia.  Psychiatric/Behavioral:  Negative for depression. The patient is not nervous/anxious.     Objective:  Vitals: BP 120/80   Pulse 88   Ht 5\' 4"  (1.626 m)   Wt 146 lb (66.2 kg)   SpO2 98%   BMI 25.06 kg/m  Physical Exam Vitals and nursing note reviewed.  Constitutional:      General: She is not in acute distress.    Appearance: Normal appearance. She is not ill-appearing or toxic-appearing.  HENT:     Head: Normocephalic and atraumatic.     Right Ear: Hearing, tympanic membrane, ear canal and external ear normal.     Left Ear: Hearing, tympanic membrane, ear canal and external ear normal.     Mouth/Throat:     Pharynx: Oropharynx is clear.  Eyes:     Extraocular Movements: Extraocular movements intact.     Pupils: Pupils are equal, round, and reactive to light.  Neck:     Thyroid : No thyroid  mass, thyromegaly or thyroid  tenderness.     Vascular: No carotid bruit.  Cardiovascular:     Rate and Rhythm: Normal rate and regular rhythm. No extrasystoles are present.    Pulses:          Dorsalis pedis pulses are 2+ on the right side and 2+ on the left side.     Heart sounds: Normal heart sounds. No murmur heard.    No friction rub. No gallop.  Pulmonary:     Effort: Pulmonary effort is normal.     Breath sounds: Normal breath sounds. No decreased breath sounds, wheezing, rhonchi or rales.  Chest:     Chest wall: No mass.  Breasts:    Right: Normal. No mass or tenderness.     Left: Normal. No mass or tenderness.  Abdominal:     Palpations: Abdomen is soft. There is no hepatomegaly, splenomegaly or mass.     Tenderness: There is no abdominal tenderness.     Hernia: No hernia is present.  Genitourinary:     Adnexa: Right adnexa normal and left adnexa normal.     Rectum: Guaiac result negative.  Musculoskeletal:     Cervical back: Normal range of motion.     Right lower leg: No edema.     Left lower leg: No edema.  Lymphadenopathy:     Cervical: No cervical adenopathy.     Upper Body:     Right upper body: No supraclavicular adenopathy.     Left upper body: No supraclavicular adenopathy.  Skin:    General: Skin is warm and dry.  Neurological:     General: No focal deficit present.     Mental Status: She is alert and oriented to person, place, and time. Mental status is at baseline.     Sensory: Sensation is intact.     Motor: Motor function is intact. No weakness.     Deep Tendon Reflexes: Reflexes are normal and symmetric.  Psychiatric:        Attention and Perception: Attention normal.  Mood and Affect: Mood normal.        Speech: Speech normal.        Behavior: Behavior normal.        Thought Content: Thought content normal.        Cognition and Memory: Cognition normal.        Judgment: Judgment normal.   Most Recent Functional Status Assessment:    09/08/2023    9:54 AM  In your present state of health, do you have any difficulty performing the following activities:  Hearing? 0  Vision? 0  Difficulty concentrating or making decisions? 0  Walking or climbing stairs? 0  Dressing or bathing? 0  Doing errands, shopping? 0  Preparing Food and eating ? N  Using the Toilet? N  In the past six months, have you accidently leaked urine? N  Do you have problems with loss of bowel control? N  Managing your Medications? N  Managing your Finances? N  Housekeeping or managing your Housekeeping? N   Most Recent Fall Risk Assessment:    09/08/2023    9:54 AM  Fall Risk   Falls in the past year? 0  Number falls in past yr: 0  Injury with Fall? 0  Risk for fall due to : No Fall Risks  Follow up Education provided;Falls evaluation completed;Falls prevention discussed   Most  Recent Depression Screenings:    09/08/2023    9:54 AM 08/19/2022   10:06 AM  PHQ 2/9 Scores  PHQ - 2 Score 0 0   Most Recent Cognitive Screening:    09/08/2023   10:04 AM  6CIT Screen  What Year? 0 points  What month? 0 points  What time? 0 points  Count back from 20 0 points  Months in reverse 0 points  Repeat phrase 0 points  Total Score 0 points   Results:  Studies Obtained And Personally Reviewed By Me:  Mammogram 12/26/2022  normal.  Colonoscopy 04/04/2016  found multiple Medium-mouthed Diverticula in the sigmoid colon; otherwise normal.   Bone Density 08/27/2022  T-score Right Femoral Neck  -2.4, osteopenic.   Labs:     Component Value Date/Time   NA 136 09/07/2023 0910   K 5.2 09/07/2023 0910   CL 102 09/07/2023 0910   CO2 27 09/07/2023 0910   GLUCOSE 101 (H) 09/07/2023 0910   BUN 21 09/07/2023 0910   CREATININE 0.97 (H) 09/07/2023 0910   CALCIUM  9.6 09/07/2023 0910   PROT 7.1 09/07/2023 0910   ALBUMIN 4.3 06/10/2016 1031   AST 17 09/07/2023 0910   ALT 7 09/07/2023 0910   ALKPHOS 54 06/10/2016 1031   BILITOT 0.5 09/07/2023 0910   GFRNONAA 60 08/02/2020 0954   GFRAA 70 08/02/2020 0954    Lab Results  Component Value Date   WBC 6.7 09/07/2023   HGB 13.6 09/07/2023   HCT 40.8 09/07/2023   MCV 95.6 09/07/2023   PLT 299 09/07/2023   Lab Results  Component Value Date   CHOL 181 09/07/2023   HDL 70 09/07/2023   LDLCALC 94 09/07/2023   TRIG 82 09/07/2023   CHOLHDL 2.6 09/07/2023   Lab Results  Component Value Date   TSH 1.73 09/07/2023    Assessment & Plan:  No orders of the defined types were placed in this encounter.  No orders of the defined types were placed in this encounter. Other Labs Reviewed today: CBC: WNL CMP, compared to 08/2022: Glucose 101, elevated from 97; Creatinine 0.97, decreased from 98; otherwise WNL.  Lipid Panel: WNL TSH: 1.73  Hypertension- mild  treated with Lisinopril  5 mg daily. Blood Pressure: normotensive today at  120/80. Stable BP on very low dose lisinopril .   Hx of Hyperlipidemia w/ 09/07/2023 Lipid Panel is normal. Lipids have not been elevated since 2022 and are controlled with diet and exercise.  Insomnia treated with Lunesta  2 mg as needed. Chronic insomnia. No side effects on Lunesta .  Hearing Loss managed w/ hearing aids.    Mammogram 12/26/2022  normal  with repeat recommendation of 2025.  Colonoscopy 04/04/2016  found multiple Medium-mouthed Diverticula in the sigmoid colon; otherwise normal. No repeat recommended.  Bone Density 08/27/2022  T-score Right Femoral Neck  -2.4, osteopenic.   Osteopenia managed with Calcium  carbonate 1000 mg twice daily and Vitamin-D 5000 units daily.   Hx of irritable bowel syndrome- currently not on medication  Vaccine Counseling: Due for Covid-19, Shingles 1/2, and Tdap; UTD on Flu and PNA.  Return in about 1 year (around 09/07/2024) for annual visit or as needed.   Annual wellness visit done today including the all of the following: Reviewed patient's Family Medical History Reviewed and updated list of patient's medical providers Assessment of cognitive impairment was done Assessed patient's functional ability Established a written schedule for health screening services Health Risk Assessent Completed and Reviewed  Discussed health benefits of physical activity, and encouraged her to engage in regular exercise appropriate for her age and condition.    I,Emily Lagle,acting as a Neurosurgeon for Sylvan Evener, MD.,have documented all relevant documentation on the behalf of Sylvan Evener, MD,as directed by  Sylvan Evener, MD while in the presence of Sylvan Evener, MD.   I, Sylvan Evener, MD, have reviewed all documentation for this visit. The documentation on 09/10/23 for the exam, diagnosis, procedures, and orders are all accurate and complete.

## 2023-09-07 ENCOUNTER — Other Ambulatory Visit: Payer: Medicare Other

## 2023-09-07 DIAGNOSIS — H903 Sensorineural hearing loss, bilateral: Secondary | ICD-10-CM | POA: Diagnosis not present

## 2023-09-07 DIAGNOSIS — Z0000 Encounter for general adult medical examination without abnormal findings: Secondary | ICD-10-CM

## 2023-09-07 DIAGNOSIS — F5101 Primary insomnia: Secondary | ICD-10-CM | POA: Diagnosis not present

## 2023-09-07 DIAGNOSIS — M810 Age-related osteoporosis without current pathological fracture: Secondary | ICD-10-CM

## 2023-09-07 DIAGNOSIS — K580 Irritable bowel syndrome with diarrhea: Secondary | ICD-10-CM

## 2023-09-07 DIAGNOSIS — E7800 Pure hypercholesterolemia, unspecified: Secondary | ICD-10-CM | POA: Diagnosis not present

## 2023-09-07 DIAGNOSIS — I10 Essential (primary) hypertension: Secondary | ICD-10-CM

## 2023-09-08 ENCOUNTER — Ambulatory Visit: Payer: Medicare Other | Admitting: Internal Medicine

## 2023-09-08 ENCOUNTER — Other Ambulatory Visit: Payer: Medicare Other

## 2023-09-08 ENCOUNTER — Encounter: Payer: Self-pay | Admitting: Internal Medicine

## 2023-09-08 VITALS — BP 120/80 | HR 88 | Ht 64.0 in | Wt 146.0 lb

## 2023-09-08 DIAGNOSIS — E7800 Pure hypercholesterolemia, unspecified: Secondary | ICD-10-CM

## 2023-09-08 DIAGNOSIS — Z0000 Encounter for general adult medical examination without abnormal findings: Secondary | ICD-10-CM | POA: Diagnosis not present

## 2023-09-08 DIAGNOSIS — M810 Age-related osteoporosis without current pathological fracture: Secondary | ICD-10-CM

## 2023-09-08 DIAGNOSIS — Z1211 Encounter for screening for malignant neoplasm of colon: Secondary | ICD-10-CM

## 2023-09-08 DIAGNOSIS — Z9889 Other specified postprocedural states: Secondary | ICD-10-CM | POA: Diagnosis not present

## 2023-09-08 DIAGNOSIS — Z87898 Personal history of other specified conditions: Secondary | ICD-10-CM | POA: Diagnosis not present

## 2023-09-08 DIAGNOSIS — H903 Sensorineural hearing loss, bilateral: Secondary | ICD-10-CM | POA: Diagnosis not present

## 2023-09-08 DIAGNOSIS — F5101 Primary insomnia: Secondary | ICD-10-CM

## 2023-09-08 DIAGNOSIS — I10 Essential (primary) hypertension: Secondary | ICD-10-CM | POA: Diagnosis not present

## 2023-09-08 DIAGNOSIS — K580 Irritable bowel syndrome with diarrhea: Secondary | ICD-10-CM | POA: Diagnosis not present

## 2023-09-08 LAB — TSH: TSH: 1.73 m[IU]/L (ref 0.40–4.50)

## 2023-09-08 LAB — CBC WITH DIFFERENTIAL/PLATELET
Absolute Lymphocytes: 2479 {cells}/uL (ref 850–3900)
Absolute Monocytes: 650 {cells}/uL (ref 200–950)
Basophils Absolute: 47 {cells}/uL (ref 0–200)
Basophils Relative: 0.7 %
Eosinophils Absolute: 188 {cells}/uL (ref 15–500)
Eosinophils Relative: 2.8 %
HCT: 40.8 % (ref 35.0–45.0)
Hemoglobin: 13.6 g/dL (ref 11.7–15.5)
MCH: 31.9 pg (ref 27.0–33.0)
MCHC: 33.3 g/dL (ref 32.0–36.0)
MCV: 95.6 fL (ref 80.0–100.0)
MPV: 11.6 fL (ref 7.5–12.5)
Monocytes Relative: 9.7 %
Neutro Abs: 3337 {cells}/uL (ref 1500–7800)
Neutrophils Relative %: 49.8 %
Platelets: 299 10*3/uL (ref 140–400)
RBC: 4.27 10*6/uL (ref 3.80–5.10)
RDW: 12 % (ref 11.0–15.0)
Total Lymphocyte: 37 %
WBC: 6.7 10*3/uL (ref 3.8–10.8)

## 2023-09-08 LAB — LIPID PANEL
Cholesterol: 181 mg/dL (ref <200)
HDL: 70 mg/dL (ref >=50)
LDL Cholesterol (Calc): 94 mg/dL
Non-HDL Cholesterol (Calc): 111 mg/dL (ref <130)
Total CHOL/HDL Ratio: 2.6 (calc) (ref <5.0)
Triglycerides: 82 mg/dL (ref <150)

## 2023-09-08 LAB — COMPLETE METABOLIC PANEL WITHOUT GFR
AG Ratio: 1.6 (calc) (ref 1.0–2.5)
ALT: 7 U/L (ref 6–29)
AST: 17 U/L (ref 10–35)
Albumin: 4.4 g/dL (ref 3.6–5.1)
Alkaline phosphatase (APISO): 56 U/L (ref 37–153)
BUN/Creatinine Ratio: 22 (calc) (ref 6–22)
BUN: 21 mg/dL (ref 7–25)
CO2: 27 mmol/L (ref 20–32)
Calcium: 9.6 mg/dL (ref 8.6–10.4)
Chloride: 102 mmol/L (ref 98–110)
Creat: 0.97 mg/dL — ABNORMAL HIGH (ref 0.60–0.95)
Globulin: 2.7 g/dL (ref 1.9–3.7)
Glucose, Bld: 101 mg/dL — ABNORMAL HIGH (ref 65–99)
Potassium: 5.2 mmol/L (ref 3.5–5.3)
Sodium: 136 mmol/L (ref 135–146)
Total Bilirubin: 0.5 mg/dL (ref 0.2–1.2)
Total Protein: 7.1 g/dL (ref 6.1–8.1)

## 2023-09-08 LAB — POCT URINALYSIS DIP (CLINITEK)
Bilirubin, UA: NEGATIVE
Blood, UA: NEGATIVE
Glucose, UA: NEGATIVE mg/dL
Ketones, POC UA: NEGATIVE mg/dL
Leukocytes, UA: NEGATIVE
Nitrite, UA: NEGATIVE
POC PROTEIN,UA: NEGATIVE
Spec Grav, UA: 1.015 (ref 1.010–1.025)
Urobilinogen, UA: 0.2 U/dL
pH, UA: 6 (ref 5.0–8.0)

## 2023-09-08 LAB — HEMOCCULT GUIAC POC 1CARD (OFFICE)
Card #1 Date: 5062025
Fecal Occult Blood, POC: NEGATIVE

## 2023-09-10 NOTE — Patient Instructions (Signed)
 It was a pleasure to see you today. Continue current meds. Blood pressure is stable on low dose medication. Please return in one year or as needed.

## 2023-11-28 ENCOUNTER — Other Ambulatory Visit: Payer: Self-pay | Admitting: Internal Medicine

## 2023-12-29 DIAGNOSIS — Z1231 Encounter for screening mammogram for malignant neoplasm of breast: Secondary | ICD-10-CM | POA: Diagnosis not present

## 2023-12-29 LAB — HM MAMMOGRAPHY

## 2023-12-31 ENCOUNTER — Encounter: Payer: Self-pay | Admitting: Internal Medicine

## 2024-02-10 DIAGNOSIS — L821 Other seborrheic keratosis: Secondary | ICD-10-CM | POA: Diagnosis not present

## 2024-02-10 DIAGNOSIS — D1801 Hemangioma of skin and subcutaneous tissue: Secondary | ICD-10-CM | POA: Diagnosis not present

## 2024-02-10 DIAGNOSIS — L570 Actinic keratosis: Secondary | ICD-10-CM | POA: Diagnosis not present

## 2024-02-10 DIAGNOSIS — L814 Other melanin hyperpigmentation: Secondary | ICD-10-CM | POA: Diagnosis not present

## 2024-02-10 DIAGNOSIS — D225 Melanocytic nevi of trunk: Secondary | ICD-10-CM | POA: Diagnosis not present

## 2024-02-10 DIAGNOSIS — Z85828 Personal history of other malignant neoplasm of skin: Secondary | ICD-10-CM | POA: Diagnosis not present

## 2024-03-11 DIAGNOSIS — Z23 Encounter for immunization: Secondary | ICD-10-CM | POA: Diagnosis not present

## 2024-04-12 DIAGNOSIS — H04123 Dry eye syndrome of bilateral lacrimal glands: Secondary | ICD-10-CM | POA: Diagnosis not present

## 2024-04-12 DIAGNOSIS — H524 Presbyopia: Secondary | ICD-10-CM | POA: Diagnosis not present

## 2024-04-12 DIAGNOSIS — H43813 Vitreous degeneration, bilateral: Secondary | ICD-10-CM | POA: Diagnosis not present

## 2024-04-12 DIAGNOSIS — H52223 Regular astigmatism, bilateral: Secondary | ICD-10-CM | POA: Diagnosis not present

## 2024-04-12 DIAGNOSIS — H35033 Hypertensive retinopathy, bilateral: Secondary | ICD-10-CM | POA: Diagnosis not present

## 2024-04-12 DIAGNOSIS — H26493 Other secondary cataract, bilateral: Secondary | ICD-10-CM | POA: Diagnosis not present

## 2024-05-26 ENCOUNTER — Other Ambulatory Visit: Payer: Self-pay | Admitting: Internal Medicine

## 2024-09-12 ENCOUNTER — Other Ambulatory Visit: Payer: Self-pay

## 2024-09-13 ENCOUNTER — Ambulatory Visit: Payer: Self-pay | Admitting: Internal Medicine
